# Patient Record
Sex: Female | Born: 1990
Health system: Southern US, Community
[De-identification: ages and names within clinical notes are randomized; demographics above are authoritative.]

## PROBLEM LIST (undated history)

## (undated) DIAGNOSIS — Z789 Other specified health status: Secondary | ICD-10-CM

## (undated) DIAGNOSIS — T7840XA Allergy, unspecified, initial encounter: Secondary | ICD-10-CM

## (undated) HISTORY — PX: NO PAST SURGERIES: SHX2092

## (undated) HISTORY — DX: Allergy, unspecified, initial encounter: T78.40XA

---

## 2015-04-24 LAB — OB RESULTS CONSOLE GC/CHLAMYDIA
Chlamydia: NEGATIVE
Gonorrhea: NEGATIVE

## 2015-04-24 LAB — OB RESULTS CONSOLE HIV ANTIBODY (ROUTINE TESTING): HIV: NONREACTIVE

## 2015-04-24 LAB — OB RESULTS CONSOLE RUBELLA ANTIBODY, IGM: Rubella: IMMUNE

## 2015-04-24 LAB — OB RESULTS CONSOLE ABO/RH: RH Type: POSITIVE

## 2015-04-24 LAB — OB RESULTS CONSOLE ANTIBODY SCREEN: Antibody Screen: NEGATIVE

## 2015-04-24 LAB — OB RESULTS CONSOLE RPR: RPR: NONREACTIVE

## 2015-04-24 LAB — OB RESULTS CONSOLE HEPATITIS B SURFACE ANTIGEN: Hepatitis B Surface Ag: NEGATIVE

## 2015-06-08 NOTE — L&D Delivery Note (Signed)
Delivery Note At 4:32 AM a viable female was delivered via Vaginal, Spontaneous Delivery (Presentation: Left Occiput Anterior).  APGAR: per nursing notes- spontaneous cry, good tone and color , ; weight  .  pending Placenta status: Intact, Spontaneous.  Cord: 3 vessels with the following complications: Marginal cord insertion and true Knot in cord.  Cord pH: not indicated  Anesthesia: Epidural  Episiotomy: None Lacerations: 1st degree Suture Repair: 3.0 vicryl rapide single figure of 8 placed to control bleeding Est. Blood Loss (mL):  300cc  Mom to postpartum.  Baby to Couplet care / Skin to Skin.  Shenna Brissette A. 11/03/2015, 4:44 AM

## 2015-10-08 LAB — OB RESULTS CONSOLE GBS: GBS: NEGATIVE

## 2015-11-03 ENCOUNTER — Inpatient Hospital Stay (HOSPITAL_COMMUNITY): Payer: 59 | Admitting: Anesthesiology

## 2015-11-03 ENCOUNTER — Encounter (HOSPITAL_COMMUNITY): Payer: Self-pay

## 2015-11-03 ENCOUNTER — Inpatient Hospital Stay (HOSPITAL_COMMUNITY)
Admission: AD | Admit: 2015-11-03 | Discharge: 2015-11-04 | DRG: 775 | Disposition: A | Discharge status: Home / Self Care | Payer: 59 | Source: Ambulatory Visit | Attending: Obstetrics | Admitting: Obstetrics

## 2015-11-03 DIAGNOSIS — O43123 Velamentous insertion of umbilical cord, third trimester: Secondary | ICD-10-CM | POA: Diagnosis present

## 2015-11-03 DIAGNOSIS — Z3A39 39 weeks gestation of pregnancy: Secondary | ICD-10-CM

## 2015-11-03 DIAGNOSIS — IMO0001 Reserved for inherently not codable concepts without codable children: Secondary | ICD-10-CM

## 2015-11-03 HISTORY — DX: Other specified health status: Z78.9

## 2015-11-03 LAB — TYPE AND SCREEN
ABO/RH(D): O POS
Antibody Screen: NEGATIVE

## 2015-11-03 LAB — ABO/RH: ABO/RH(D): O POS

## 2015-11-03 LAB — CBC
HCT: 36.3 % (ref 36.0–46.0)
Hemoglobin: 11.7 g/dL — ABNORMAL LOW (ref 12.0–15.0)
MCH: 28.4 pg (ref 26.0–34.0)
MCHC: 32.2 g/dL (ref 30.0–36.0)
MCV: 88.1 fL (ref 78.0–100.0)
Platelets: 208 10*3/uL (ref 150–400)
RBC: 4.12 MIL/uL (ref 3.87–5.11)
RDW: 13.1 % (ref 11.5–15.5)
WBC: 9.6 10*3/uL (ref 4.0–10.5)

## 2015-11-03 MED ORDER — OXYTOCIN BOLUS FROM INFUSION
500.0000 mL | INTRAVENOUS | Status: DC
  Administered 2015-11-03: 500 mL via INTRAVENOUS

## 2015-11-03 MED ORDER — ACETAMINOPHEN 325 MG PO TABS: 650.0000 mg | ORAL_TABLET | ORAL | Status: DC | PRN

## 2015-11-03 MED ORDER — SIMETHICONE 80 MG PO CHEW: 80.0000 mg | CHEWABLE_TABLET | ORAL | Status: DC | PRN

## 2015-11-03 MED ORDER — DIPHENHYDRAMINE HCL 25 MG PO CAPS: 25.0000 mg | ORAL_CAPSULE | Freq: Four times a day (QID) | ORAL | Status: DC | PRN

## 2015-11-03 MED ORDER — DIBUCAINE 1 % RE OINT
1.0000 "application " | TOPICAL_OINTMENT | RECTAL | Status: DC | PRN
  Administered 2015-11-03: 1 via RECTAL
  Filled 2015-11-03: qty 28

## 2015-11-03 MED ORDER — COCONUT OIL OIL: 1.0000 "application " | TOPICAL_OIL | Status: DC | PRN

## 2015-11-03 MED ORDER — LACTATED RINGERS IV SOLN: 500.0000 mL | Freq: Once | INTRAVENOUS | Status: DC

## 2015-11-03 MED ORDER — SOD CITRATE-CITRIC ACID 500-334 MG/5ML PO SOLN: 30.0000 mL | ORAL | Status: DC | PRN

## 2015-11-03 MED ORDER — LIDOCAINE HCL (PF) 1 % IJ SOLN
30.0000 mL | INTRAMUSCULAR | Status: DC | PRN
  Filled 2015-11-03: qty 30

## 2015-11-03 MED ORDER — EPHEDRINE 5 MG/ML INJ: 10.0000 mg | INTRAVENOUS | Status: DC | PRN

## 2015-11-03 MED ORDER — OXYCODONE-ACETAMINOPHEN 5-325 MG PO TABS: 1.0000 | ORAL_TABLET | ORAL | Status: DC | PRN

## 2015-11-03 MED ORDER — PHENYLEPHRINE 40 MCG/ML (10ML) SYRINGE FOR IV PUSH (FOR BLOOD PRESSURE SUPPORT): 80.0000 ug | PREFILLED_SYRINGE | INTRAVENOUS | Status: DC | PRN

## 2015-11-03 MED ORDER — ONDANSETRON HCL 4 MG PO TABS: 4.0000 mg | ORAL_TABLET | ORAL | Status: DC | PRN

## 2015-11-03 MED ORDER — OXYCODONE-ACETAMINOPHEN 5-325 MG PO TABS: 2.0000 | ORAL_TABLET | ORAL | Status: DC | PRN

## 2015-11-03 MED ORDER — PRENATAL MULTIVITAMIN CH
1.0000 | ORAL_TABLET | Freq: Every day | ORAL | Status: DC
  Administered 2015-11-03 – 2015-11-04 (×2): 1 via ORAL
  Filled 2015-11-03 (×2): qty 1

## 2015-11-03 MED ORDER — SENNOSIDES-DOCUSATE SODIUM 8.6-50 MG PO TABS
2.0000 | ORAL_TABLET | ORAL | Status: DC
  Administered 2015-11-03: 2 via ORAL
  Filled 2015-11-03: qty 2

## 2015-11-03 MED ORDER — WITCH HAZEL-GLYCERIN EX PADS
1.0000 "application " | MEDICATED_PAD | CUTANEOUS | Status: DC | PRN
  Administered 2015-11-03: 1 via TOPICAL

## 2015-11-03 MED ORDER — IBUPROFEN 600 MG PO TABS
600.0000 mg | ORAL_TABLET | Freq: Four times a day (QID) | ORAL | Status: DC
  Administered 2015-11-03 – 2015-11-04 (×6): 600 mg via ORAL
  Filled 2015-11-03 (×6): qty 1

## 2015-11-03 MED ORDER — ONDANSETRON HCL 4 MG/2ML IJ SOLN: 4.0000 mg | Freq: Four times a day (QID) | INTRAMUSCULAR | Status: DC | PRN

## 2015-11-03 MED ORDER — ZOLPIDEM TARTRATE 5 MG PO TABS: 5.0000 mg | ORAL_TABLET | Freq: Every evening | ORAL | Status: DC | PRN

## 2015-11-03 MED ORDER — PHENYLEPHRINE 40 MCG/ML (10ML) SYRINGE FOR IV PUSH (FOR BLOOD PRESSURE SUPPORT)
80.0000 ug | PREFILLED_SYRINGE | INTRAVENOUS | Status: DC | PRN
Start: 2015-11-03 — End: 2015-11-03
  Filled 2015-11-03: qty 10

## 2015-11-03 MED ORDER — FENTANYL 2.5 MCG/ML BUPIVACAINE 1/10 % EPIDURAL INFUSION (WH - ANES)
14.0000 mL/h | INTRAMUSCULAR | Status: DC | PRN
  Administered 2015-11-03: 14 mL/h via EPIDURAL
  Filled 2015-11-03: qty 125

## 2015-11-03 MED ORDER — LACTATED RINGERS IV SOLN: 500.0000 mL | INTRAVENOUS | Status: DC | PRN

## 2015-11-03 MED ORDER — DIPHENHYDRAMINE HCL 50 MG/ML IJ SOLN: 12.5000 mg | INTRAMUSCULAR | Status: DC | PRN

## 2015-11-03 MED ORDER — ONDANSETRON HCL 4 MG/2ML IJ SOLN: 4.0000 mg | INTRAMUSCULAR | Status: DC | PRN

## 2015-11-03 MED ORDER — BENZOCAINE-MENTHOL 20-0.5 % EX AERO
1.0000 "application " | INHALATION_SPRAY | CUTANEOUS | Status: DC | PRN
  Administered 2015-11-03: 1 via TOPICAL
  Filled 2015-11-03: qty 56

## 2015-11-03 MED ORDER — LACTATED RINGERS IV SOLN
INTRAVENOUS | Status: DC
  Administered 2015-11-03: 01:00:00 via INTRAVENOUS

## 2015-11-03 MED ORDER — LIDOCAINE HCL (PF) 1 % IJ SOLN
INTRAMUSCULAR | Status: DC | PRN
  Administered 2015-11-03: 3 mL
  Administered 2015-11-03: 5 mL
  Administered 2015-11-03: 2 mL

## 2015-11-03 MED ORDER — FLEET ENEMA 7-19 GM/118ML RE ENEM: 1.0000 | ENEMA | RECTAL | Status: DC | PRN

## 2015-11-03 MED ORDER — OXYTOCIN 40 UNITS IN LACTATED RINGERS INFUSION - SIMPLE MED
2.5000 [IU]/h | INTRAVENOUS | Status: DC
  Filled 2015-11-03: qty 1000

## 2015-11-03 MED ORDER — TETANUS-DIPHTH-ACELL PERTUSSIS 5-2.5-18.5 LF-MCG/0.5 IM SUSP: 0.5000 mL | Freq: Once | INTRAMUSCULAR | Status: DC

## 2015-11-03 NOTE — Anesthesia Preprocedure Evaluation (Signed)
Anesthesia Evaluation  Patient identified by MRN, date of birth, ID band Patient awake    Reviewed: Allergy & Precautions, Patient's Chart, lab work & pertinent test results  Airway Mallampati: II  TM Distance: >3 FB     Dental   Pulmonary neg pulmonary ROS,    Pulmonary exam normal        Cardiovascular negative cardio ROS Normal cardiovascular exam     Neuro/Psych negative neurological ROS     GI/Hepatic negative GI ROS, Neg liver ROS,   Endo/Other  negative endocrine ROS  Renal/GU negative Renal ROS     Musculoskeletal   Abdominal   Peds  Hematology negative hematology ROS (+)   Anesthesia Other Findings   Reproductive/Obstetrics (+) Pregnancy                             Lab Results  Component Value Date   WBC 9.6 11/03/2015   HGB 11.7* 11/03/2015   HCT 36.3 11/03/2015   MCV 88.1 11/03/2015   PLT 208 11/03/2015    Anesthesia Physical Anesthesia Plan  ASA: II  Anesthesia Plan: Epidural   Post-op Pain Management:    Induction:   Airway Management Planned: Natural Airway  Additional Equipment:   Intra-op Plan:   Post-operative Plan:   Informed Consent: I have reviewed the patients History and Physical, chart, labs and discussed the procedure including the risks, benefits and alternatives for the proposed anesthesia with the patient or authorized representative who has indicated his/her understanding and acceptance.     Plan Discussed with:   Anesthesia Plan Comments:         Anesthesia Quick Evaluation

## 2015-11-03 NOTE — Anesthesia Pain Management Evaluation Note (Signed)
  CRNA Pain Management Visit Note  Patient: Lauren Weaver, 25 y.o., female  "Hello I am a member of the anesthesia team at [facility]. We have an anesthesia team available at all times to provide care throughout the hospital, including epidural management and anesthesia for C-section. I don't know your plan for the delivery whether it a natural birth, water birth, IV sedation, nitrous supplementation, doula or epidural, but we want to meet your pain goals."   1.Was your pain managed to your expectations on prior hospitalizations?  yes}  2.What is your expectation for pain management during this hospitalization?     epidural  3.How can we help you reach that goal? epidural  Record the patient's initial score and the patient's pain goal.   Pain: 8  Pain Goal: 0/10 [facility] wants you to be able to say your pain was always managed very well.  Salome ArntSterling, Magin Balbi Marie 11/03/2015

## 2015-11-03 NOTE — MAU Note (Signed)
Pt c/o contractions that started at 930pm. Was 4cm dilated at last exam. Denies LOF or vag bleeding. +FM.

## 2015-11-03 NOTE — Anesthesia Procedure Notes (Signed)
Epidural Patient location during procedure: OB  Staffing Anesthesiologist: Marcene DuosFITZGERALD, Dannica Bickham Performed by: anesthesiologist   Preanesthetic Checklist Completed: patient identified, site marked, surgical consent, pre-op evaluation, timeout performed, IV checked, risks and benefits discussed and monitors and equipment checked  Epidural Patient position: sitting Prep: site prepped and draped and DuraPrep Patient monitoring: continuous pulse ox and blood pressure Approach: midline Location: L4-L5 Injection technique: LOR air  Needle:  Needle type: Tuohy  Needle gauge: 17 G Needle length: 9 cm and 9 Needle insertion depth: 5 cm cm Catheter type: closed end flexible Catheter size: 19 Gauge Catheter at skin depth: 10.5 cm Test dose: negative  Assessment Events: blood not aspirated, injection not painful, no injection resistance, negative IV test and no paresthesia

## 2015-11-03 NOTE — Anesthesia Postprocedure Evaluation (Signed)
Anesthesia Post Note  Patient: Lauren SalkJennifer Billick  Procedure(s) Performed: * No procedures listed *  Patient location during evaluation: Mother Baby Anesthesia Type: Epidural Level of consciousness: oriented and awake and alert Pain management: pain level controlled Vital Signs Assessment: post-procedure vital signs reviewed and stable Respiratory status: spontaneous breathing and nonlabored ventilation Cardiovascular status: stable Postop Assessment: epidural receding, patient able to bend at knees, no signs of nausea or vomiting and adequate PO intake Anesthetic complications: no     Last Vitals:  Filed Vitals:   11/03/15 0645 11/03/15 1027  BP: 113/65 103/65  Pulse: 63 63  Temp: 37 C 36.7 C  Resp: 16     Last Pain:  Filed Vitals:   11/03/15 1039  PainSc: 0-No pain   Pain Goal:                 Briann Sarchet Hristova

## 2015-11-03 NOTE — Lactation Note (Signed)
This note was copied from a baby's chart. Lactation Consultation Note  Patient Name: Lauren Weaver XBJYN'WToday's Date: 11/03/2015 Reason for consult: Initial assessment Baby at 7 hr of life. Mom reports "pinchin" when baby first latches but is goes away after "a few seconds" . Answered questions about nipple creams. Discussed baby behavior, feeding frequency, pumping, baby belly size, voids, wt loss, breast changes, and nipple care. Demonstrated manual expression, colostrum noted bilaterally, spoon in room. Given lactation handouts. Aware of OP services and support group. Mom will call at next feeding for latch check.      Maternal Data Has patient been taught Hand Expression?: Yes  Feeding Feeding Type: Breast Fed Length of feed: 30 min  LATCH Score/Interventions Latch: Repeated attempts needed to sustain latch, nipple held in mouth throughout feeding, stimulation needed to elicit sucking reflex. Intervention(s): Assist with latch  Audible Swallowing: A few with stimulation Intervention(s): Skin to skin;Hand expression  Type of Nipple: Everted at rest and after stimulation  Comfort (Breast/Nipple): Soft / non-tender     Hold (Positioning): No assistance needed to correctly position infant at breast.  LATCH Score: 8  Lactation Tools Discussed/Used WIC Program: No   Consult Status Consult Status: Follow-up Date: 11/04/15 Follow-up type: In-patient    Rulon Eisenmengerlizabeth E Tanda Morrissey 11/03/2015, 11:40 AM

## 2015-11-03 NOTE — H&P (Signed)
Lauren SalkJennifer Cosper is a 25 y.o. G2P1001 at 2317w0d presenting for contraction. Pt notes contractions getting stronger . Good fetal movement, No vaginal bleeding, not leaking fluid.  [facility] 11 wks - h/o rapid labor as G1, 38 wks was 4/90% - 22# wt gain   Prenatal Transfer Tool  Maternal Diabetes: No Genetic Screening: Declined Maternal Ultrasounds/Referrals: Normal Fetal Ultrasounds or other Referrals:  None Maternal Substance Abuse:  No Significant Maternal Medications:  None Significant Maternal Lab Results: None     OB History    Gravida Para Term Preterm AB TAB SAB Ectopic Multiple Living   2 1 1       1      Past Medical History  Diagnosis Date  . Medical history non-contributory    Past Surgical History  Procedure Laterality Date  . No past surgeries     Family History: family history is not on file. Social History:  reports that she has never smoked. She does not have any smokeless tobacco history on file. She reports that she does not drink alcohol or use illicit drugs.  Review of Systems - Negative except contractions   Dilation: 6 Effacement (%): 90 Station: -1 Exam by:: V Rogers RN  Blood pressure 124/73, pulse 61, temperature 98.6 F (37 C), temperature source Oral, resp. rate 16, height 5\' 3"  (1.6 m), weight 62.596 kg (138 lb), SpO2 96 %.  Physical Exam:  Gen: well appearing, no distress Abd: gravid, NT, no RUQ pain LE: no edema, equal bilaterally, non-tender Toco: triplets q891min every 3-4 min FH: baseline 135s, accelerations present, no deceleratons, 10 beat variability  Prenatal labs: ABO, Rh: --/--/O POS (05/29 0123) Antibody: NEG (05/29 0123) Rubella: !Error! immune RPR: Nonreactive (11/17 0000)  HBsAg: Negative (11/17 0000)  HIV: Non-reactive (11/17 0000)  GBS: Negative (05/03 0000)  1 hr Glucola 99  Genetic screening declined, nl AFP Anatomy US normal   Assessment/Plan: 25 y.o. G2P1001 at 2317w0d Active labor at  term, GBS neg, expectant management Epidural   Meldon Hanzlik A. 11/03/2015, 3:36 AM

## 2015-11-04 LAB — RPR: RPR Ser Ql: NONREACTIVE

## 2015-11-04 MED ORDER — IBUPROFEN 600 MG PO TABS: 600.0000 mg | ORAL_TABLET | Freq: Four times a day (QID) | ORAL | Status: DC

## 2015-11-04 NOTE — Progress Notes (Signed)
PPD 1 SVD with 1st degree repair  S:  Reports feeling well and desires DC today             Tolerating po/ No nausea or vomiting             Bleeding is light             Pain controlled with motrin             Up ad lib / ambulatory / voiding QS  Newborn breast feeding   O:               VS: BP 114/67 mmHg  Pulse 52  Temp(Src) 97.8 F (36.6 C) (Oral)  Resp 18  Ht 5\' 3"  (1.6 m)  Wt 62.596 kg (138 lb)  BMI 24.45 kg/m2  SpO2 100%  Breastfeeding? Unknown   LABS:              Recent Labs  11/03/15 0123  WBC 9.6  HGB 11.7*  PLT 208               Blood type: --/--/O POS, O POS (05/29 0123)  Rubella: Immune (11/17 0000)                                Physical Exam:             Alert and oriented X3  Abdomen: soft, non-tender, non-distended              Fundus: firm, non-tender, U-1  Perineum: no edema  Lochia: light  Extremities: no edema, no calf pain or tenderness    A: PPD # 1 SVD with 1st degree repair  Doing well - stable status  P: Routine post partum orders  DC home - WOB booklet - instructions reviewed  Lauren Weaver, Lauren Weaver CNM, MSN, FACNM 11/04/2015, 8:18 AM

## 2015-11-04 NOTE — Lactation Note (Signed)
This note was copied from a baby's chart. Lactation Consultation Note: Experienced BF mom reports baby has been nursing well but reports pinching throughout feeding. Baby has just finished nursing for 20 min, did latch to other breast bottom, lip untucked but mom still reports it is tender. Reports breasts are feeling fuller today. No DC today baby to go under bili lights. No questions at present. To call for assist prn   Patient Name: Lauren Hollie SalkJennifer Borgerding RUEAV'WToday's Date: 11/04/2015 Reason for consult: Follow-up assessment   Maternal Data Formula Feeding for Exclusion: No Has patient been taught Hand Expression?: Yes Does the patient have breastfeeding experience prior to this delivery?: Yes  Feeding Feeding Type: Breast Fed Length of feed: 30 min  LATCH Score/Interventions Latch: Grasps breast easily, tongue down, lips flanged, rhythmical sucking. Intervention(s): Assist with latch  Audible Swallowing: A few with stimulation  Type of Nipple: Everted at rest and after stimulation  Comfort (Breast/Nipple): Soft / non-tender  Problem noted: Mild/Moderate discomfort Interventions (Mild/moderate discomfort): Hand expression;Hand massage  Hold (Positioning): Assistance needed to correctly position infant at breast and maintain latch.  LATCH Score: 8  Lactation Tools Discussed/Used WIC Program: No   Consult Status Consult Status: PRN    Pamelia HoitWeeks, Nazier Neyhart D 11/04/2015, 9:57 AM

## 2015-11-04 NOTE — Discharge Summary (Signed)
Obstetric Discharge Summary  Reason for Admission: onset of labor Prenatal Procedures: none Intrapartum Procedures: spontaneous vaginal delivery Postpartum Procedures: none Complications-Operative and Postpartum: 1st degree perineal laceration HEMOGLOBIN  Date Value Ref Range Status  11/03/2015 11.7* 12.0 - 15.0 g/dL Final   HCT  Date Value Ref Range Status  11/03/2015 36.3 36.0 - 46.0 % Final    Physical Exam:  General: alert, cooperative and no distress Lochia: appropriate Uterine Fundus: firm Incision: healing well DVT Evaluation: No evidence of DVT seen on physical exam.  Discharge Diagnoses: Term Pregnancy-delivered  Discharge Information: Date: 11/04/2015 Activity: pelvic rest Diet: routine Medications: PNV and Ibuprofen Condition: stable Instructions: refer to practice specific booklet Discharge to: home Follow-up Information    Follow up with LAVOIE,MARIE-LYNE, MD. Schedule an appointment as soon as possible for a visit in 6 weeks.   Specialty:  Obstetrics and Gynecology   Contact information:   [address] [place] [id number] [phone]       Newborn Data: Live born female  Birth Weight: 7 lb 6 oz (3345 g) APGAR: 8, 9  Home with mother.  Lauren MikeBAILEY, TANYA 11/04/2015, 8:44 AM

## 2015-11-05 ENCOUNTER — Ambulatory Visit: Payer: Self-pay

## 2015-11-05 NOTE — Lactation Note (Signed)
This note was copied from a baby's chart. Lactation Consultation Note Baby had 10% weight loss at 45 hours old. Had 17 voids, 7 stools and several emesis. Mom breast are filling rick orange colostrum. Mom wearing supportive bra. Hand expression, DEBP given to mom to relieve breast. Discussed w/mom supplementing after BF. Baby on DPT. Baby BF well. Will cont. To monitor/ Patient Name: Girl Hollie SalkJennifer Ordoyne JXBJY'NToday's Date: 11/05/2015 Reason for consult: Follow-up assessment;Hyperbilirubinemia;Infant weight loss   Maternal Data    Feeding Feeding Type: Breast Fed Length of feed: 60 min  LATCH Score/Interventions    Intervention(s): Alternate breast massage;Hand expression  Type of Nipple: Everted at rest and after stimulation  Comfort (Breast/Nipple): Filling, red/small blisters or bruises, mild/mod discomfort  Problem noted: Filling Interventions (Filling): Massage;Firm support;Frequent nursing;Double electric pump Interventions (Mild/moderate discomfort): Post-pump;Hand massage;Hand expression        Lactation Tools Discussed/Used Tools: Pump Breast pump type: Double-Electric Breast Pump Pump Review: Setup, frequency, and cleaning;Milk Storage Initiated by:: RN Date initiated:: 11/04/15   Consult Status Consult Status: Follow-up Date: 11/05/15 (in pm) Follow-up type: In-patient    Charyl DancerCARVER, Render Marley G 11/05/2015, 6:40 AM

## 2015-11-05 NOTE — Lactation Note (Signed)
This note was copied from a baby's chart. Lactation Consultation Note  Patient Name: Lauren Weaver UJWJX'BToday's Date: 11/05/2015 Reason for consult: Follow-up assessment;Hyperbilirubinemia Baby 59 hours old. Mom reports that baby has been a little sleepy at breast. Mom reports that she is nursing with cues, and at least by 3 hours. Mom has been using DEBP after baby nursing and supplementing with EBM as needed. Enc mom to continue. Mom latched baby in cradle position to right breast. Baby had shallow latch, so demonstrated to mom how to latch baby in cross-cradle position and mom reported increased comfort. Baby latched deeply and suckled rhythmically with intermittent swallows/gulps. Enc mom to nurse often.  Maternal Data    Feeding Feeding Type: Breast Fed (Simultaneous filing. User may not have seen previous data.) Length of feed:  (LC assessed first 10 minutes of BF.  Simultaneous filing. User may not have seen previous data.)  LATCH Score/Interventions Latch: Grasps breast easily, tongue down, lips flanged, rhythmical sucking. Intervention(s): Adjust position;Assist with latch  Audible Swallowing: Spontaneous and intermittent Intervention(s): Skin to skin;Hand expression  Type of Nipple: Everted at rest and after stimulation  Comfort (Breast/Nipple): Soft / non-tender  Problem noted: Filling Interventions (Filling): Frequent nursing;Double electric pump  Hold (Positioning): Assistance needed to correctly position infant at breast and maintain latch. Intervention(s): Position options  LATCH Score: 9  Lactation Tools Discussed/Used Breast pump type: Double-Electric Breast Pump   Consult Status Consult Status: Follow-up Date: 11/06/15 Follow-up type: In-patient    Geralynn OchsWILLIARD, Lauren 11/05/2015, 4:03 PM

## 2015-11-06 ENCOUNTER — Ambulatory Visit: Payer: Self-pay

## 2015-11-06 NOTE — Lactation Note (Signed)
This note was copied from a baby's chart. Lactation Consultation Note  Mother is continuing to give baby pumped breastmilk after feeding. Mother recently pumped more than 30 ml. Reviewed engorgement care and monitoring voids/stools. Provided mother w/ hand pump.  Patient Name: Lauren Weaver SalkJennifer Kearse ZOXWR'UToday's Date: 11/06/2015 Reason for consult: Follow-up assessment   Maternal Data    Feeding Feeding Type: Breast Fed Length of feed: 30 min  LATCH Score/Interventions                      Lactation Tools Discussed/Used     Consult Status Consult Status: Complete    Hardie PulleyBerkelhammer, Ruth Boschen 11/06/2015, 10:07 AM

## 2018-05-02 ENCOUNTER — Encounter: Payer: Self-pay | Admitting: Allergy and Immunology

## 2018-05-02 ENCOUNTER — Ambulatory Visit: Payer: BLUE CROSS/BLUE SHIELD | Admitting: Allergy and Immunology

## 2018-05-02 VITALS — BP 106/66 | HR 61 | Temp 98.6°F | Resp 16 | Ht 64.0 in | Wt 132.0 lb

## 2018-05-02 DIAGNOSIS — H1013 Acute atopic conjunctivitis, bilateral: Secondary | ICD-10-CM | POA: Diagnosis not present

## 2018-05-02 DIAGNOSIS — J3089 Other allergic rhinitis: Secondary | ICD-10-CM

## 2018-05-02 DIAGNOSIS — H1010 Acute atopic conjunctivitis, unspecified eye: Secondary | ICD-10-CM | POA: Insufficient documentation

## 2018-05-02 MED ORDER — OLOPATADINE HCL 0.7 % OP SOLN: 1.0000 [drp] | OPHTHALMIC | 5 refills | Status: DC

## 2018-05-02 MED ORDER — FLUTICASONE PROPIONATE 93 MCG/ACT NA EXHU: 2.0000 | INHALANT_SUSPENSION | Freq: Two times a day (BID) | NASAL | 5 refills | Status: DC

## 2018-05-02 MED ORDER — LEVOCETIRIZINE DIHYDROCHLORIDE 5 MG PO TABS: 5.0000 mg | ORAL_TABLET | Freq: Every evening | ORAL | 5 refills | Status: DC

## 2018-05-02 NOTE — Progress Notes (Signed)
New Patient Note  RE: Lauren Weaver MRN: [id number] DOB: 1990-09-22 Date of Office Visit: 05/02/2018  Referring provider: Arlana Lindau, NP Primary care provider: Arlana Lindau, NP  Chief Complaint: Nasal Congestion; Conjunctivitis; and Sinus Problem   History of present illness: Lauren Weaver is a 27 y.o. female seen today in consultation requested by Arlana Lindau, NP.  She experiences frequent nasal congestion, rhinorrhea, sneezing, postnasal drainage, ear pressure, nasal pruritus, ocular pruritus, and lacrimation.  The symptoms are most frequent and severe during the fall, particularly September and August.  She has attempted to control the symptoms with loratadine without relief and has tried diphenhydramine with moderate benefit.  She has no history of symptoms consistent with asthma or eczema.  Assessment and plan: Allergic rhinitis  Aeroallergen avoidance measures have been discussed and provided in written form.  A prescription has been provided for levocetirizine, 5 mg daily as needed.  A prescription has been provided for [facility], 2 actuations per nostril twice a day. Proper technique has been discussed and demonstrated.  Nasal saline spray (i.e., Simply Saline) or nasal saline lavage (i.e., NeilMed) is recommended as needed and prior to medicated nasal sprays.  If allergen avoidance measures and medications fail to adequately relieve symptoms, aeroallergen immunotherapy will be considered.  Allergic conjunctivitis  Treatment plan as outlined above for allergic rhinitis.  A prescription has been provided for Pazeo, one drop per eye daily as needed.  I have also recommended eye lubricant drops (i.e., Natural Tears) as needed.   Meds ordered this encounter  Medications  . levocetirizine (XYZAL) 5 MG tablet    Sig: Take 1 tablet (5 mg total) by mouth every evening.    Dispense:  30 tablet    Refill:  5  . Fluticasone Propionate (XHANCE) 93 MCG/ACT EXHU      Sig: Place 2 sprays into both nostrils 2 (two) times daily.    Dispense:  16 mL    Refill:  5  . Olopatadine HCl (PAZEO) 0.7 % SOLN    Sig: Place 1 drop into both eyes 1 day or 1 dose.    Dispense:  1 Bottle    Refill:  5    Diagnostics: Epicutaneous testing: Robust reactivity to dust mite antigen. Intradermal testing: Positive to grass pollen, tree pollen, molds, cat hair, dog epithelia, and cockroach antigen.    Physical examination: Blood pressure 106/66, pulse 61, temperature 98.6 F (37 C), temperature source Oral, resp. rate 16, height 5\' 4"  (1.626 m), weight 132 lb (59.9 kg), SpO2 99 %, unknown if currently breastfeeding.  General: Alert, interactive, in no acute distress. HEENT: TMs pearly gray, turbinates edematous with thick discharge, post-pharynx moderately erythematous. Neck: Supple without lymphadenopathy. Lungs: Clear to auscultation without wheezing, rhonchi or rales. CV: Normal S1, S2 without murmurs. Abdomen: Nondistended, nontender. Skin: Warm and dry, without lesions or rashes. Extremities:  No clubbing, cyanosis or edema. Neuro:   Grossly intact.  Review of systems:  Review of systems negative except as noted in HPI / PMHx or noted below: Review of Systems  Constitutional: Negative.   HENT: Negative.   Eyes: Negative.   Respiratory: Negative.   Cardiovascular: Negative.   Gastrointestinal: Negative.   Genitourinary: Negative.   Musculoskeletal: Negative.   Skin: Negative.   Neurological: Negative.   Endo/Heme/Allergies: Negative.   Psychiatric/Behavioral: Negative.     Past medical history:  Past Medical History:  Diagnosis Date  . Medical history non-contributory   . Postpartum care following vaginal delivery (5/29) 11/03/2015  Past surgical history:  Past Surgical History:  Procedure Laterality Date  . NO PAST SURGERIES      Family history: History reviewed. No pertinent family history.  Social history: Social History    Socioeconomic History  . Marital status: Married    Spouse name: Not on file  . Number of children: Not on file  . Years of education: Not on file  . Highest education level: Not on file  Occupational History  . Not on file  Social Needs  . Financial resource strain: Not on file  . Food insecurity:    Worry: Not on file    Inability: Not on file  . Transportation needs:    Medical: Not on file    Non-medical: Not on file  Tobacco Use  . Smoking status: Never Smoker  . Smokeless tobacco: Never Used  Substance and Sexual Activity  . Alcohol use: No  . Drug use: No  . Sexual activity: Yes  Lifestyle  . Physical activity:    Days per week: Not on file    Minutes per session: Not on file  . Stress: Not on file  Relationships  . Social connections:    Talks on phone: Not on file    Gets together: Not on file    Attends religious service: Not on file    Active member of club or organization: Not on file    Attends meetings of clubs or organizations: Not on file    Relationship status: Not on file  . Intimate partner violence:    Fear of current or ex partner: Not on file    Emotionally abused: Not on file    Physically abused: Not on file    Forced sexual activity: Not on file  Other Topics Concern  . Not on file  Social History Narrative  . Not on file   Environmental History: Lives in a [address] year old house with carpeting the bedroom and central air/heat.  She is a non-smoker without pets.  She is uncertain if there is mold/water damage in the home.  Allergies as of 05/02/2018   No Known Allergies     Medication List        Accurate as of 05/02/18  8:21 PM. Always use your most recent med list.          Fluticasone Propionate 93 MCG/ACT Exhu Place 2 sprays into both nostrils 2 (two) times daily.   ibuprofen 600 MG tablet Commonly known as:  ADVIL,MOTRIN Take 1 tablet (600 mg total) by mouth every 6 (six) hours.   levocetirizine 5 MG tablet Commonly  known as:  XYZAL Take 1 tablet (5 mg total) by mouth every evening.   Olopatadine HCl 0.7 % Soln Place 1 drop into both eyes 1 day or 1 dose.   prenatal multivitamin Tabs tablet Take 1 tablet by mouth daily at 12 noon.       Known medication allergies: No Known Allergies  I appreciate the opportunity to take part in Kennice's care. Please do not hesitate to contact me with questions.  Sincerely,   R. Jorene Guestarter Ray Gervasi, MD

## 2018-05-02 NOTE — Patient Instructions (Addendum)
Allergic rhinitis  Aeroallergen avoidance measures have been discussed and provided in written form.  A prescription has been provided for levocetirizine, 5 mg daily as needed.  A prescription has been provided for [facility], 2 actuations per nostril twice a day. Proper technique has been discussed and demonstrated.  Nasal saline spray (i.e., Simply Saline) or nasal saline lavage (i.e., NeilMed) is recommended as needed and prior to medicated nasal sprays.  If allergen avoidance measures and medications fail to adequately relieve symptoms, aeroallergen immunotherapy will be considered.  Allergic conjunctivitis  Treatment plan as outlined above for allergic rhinitis.  A prescription has been provided for Pazeo, one drop per eye daily as needed.  I have also recommended eye lubricant drops (i.e., Natural Tears) as needed.   Return in about 4 months (around 08/31/2018), or if symptoms worsen or fail to improve.  Control of Dust Mite Allergen  House dust mites play a major role in allergic asthma and rhinitis.  They occur in environments with high humidity wherever human skin, the food for dust mites is found. High levels have been detected in dust obtained from mattresses, pillows, carpets, upholstered furniture, bed covers, clothes and soft toys.  The principal allergen of the house dust mite is found in its feces.  A gram of dust may contain 1,000 mites and 250,000 fecal particles.  Mite antigen is easily measured in the air during house cleaning activities.    1. Encase mattresses, including the box spring, and pillow, in an air tight cover.  Seal the zipper end of the encased mattresses with wide adhesive tape. 2. Wash the bedding in water of 130 degrees Farenheit weekly.  Avoid cotton comforters/quilts and flannel bedding: the most ideal bed covering is the dacron comforter. 3. Remove all upholstered furniture from the bedroom. 4. Remove carpets, carpet padding, rugs, and non-washable  window drapes from the bedroom.  Wash drapes weekly or use plastic window coverings. 5. Remove all non-washable stuffed toys from the bedroom.  Wash stuffed toys weekly. 6. Have the room cleaned frequently with a vacuum cleaner and a damp dust-mop.  The patient should not be in a room which is being cleaned and should wait 1 hour after cleaning before going into the room. 7. Close and seal all heating outlets in the bedroom.  Otherwise, the room will become filled with dust-laden air.  An electric heater can be used to heat the room. Reduce indoor humidity to less than 50%.  Do not use a humidifier.   Reducing Pollen Exposure  The American Academy of Allergy, Asthma and Immunology suggests the following steps to reduce your exposure to pollen during allergy seasons.    1. Do not hang sheets or clothing out to dry; pollen may collect on these items. 2. Do not mow lawns or spend time around freshly cut grass; mowing stirs up pollen. 3. Keep windows closed at night.  Keep car windows closed while driving. 4. Minimize morning activities outdoors, a time when pollen counts are usually at their highest. 5. Stay indoors as much as possible when pollen counts or humidity is high and on windy days when pollen tends to remain in the air longer. 6. Use air conditioning when possible.  Many air conditioners have filters that trap the pollen spores. 7. Use a HEPA room air filter to remove pollen form the indoor air you breathe.   Control of Dog or Cat Allergen  Avoidance is the best way to manage a dog or cat allergy. If  you have a dog or cat and are allergic to dog or cats, consider removing the dog or cat from the home. If you have a dog or cat but don't want to find it a new home, or if your family wants a pet even though someone in the household is allergic, here are some strategies that may help keep symptoms at bay:  1. Keep the pet out of your bedroom and restrict it to only a few rooms. Be  advised that keeping the dog or cat in only one room will not limit the allergens to that room. 2. Don't pet, hug or kiss the dog or cat; if you do, wash your hands with soap and water. 3. High-efficiency particulate air (HEPA) cleaners run continuously in a bedroom or living room can reduce allergen levels over time. 4. Place electrostatic material sheet in the air inlet vent in the bedroom. 5. Regular use of a high-efficiency vacuum cleaner or a central vacuum can reduce allergen levels. 6. Giving your dog or cat a bath at least once a week can reduce airborne allergen.   Control of Mold Allergen  Mold and fungi can grow on a variety of surfaces provided certain temperature and moisture conditions exist.  Outdoor molds grow on plants, decaying vegetation and soil.  The major outdoor mold, Alternaria and Cladosporium, are found in very high numbers during hot and dry conditions.  Generally, a late Summer - Fall peak is seen for common outdoor fungal spores.  Rain will temporarily lower outdoor mold spore count, but counts rise rapidly when the rainy period ends.  The most important indoor molds are Aspergillus and Penicillium.  Dark, humid and poorly ventilated basements are ideal sites for mold growth.  The next most common sites of mold growth are the bathroom and the kitchen.  Outdoor MicrosoftMold Control 1. Use air conditioning and keep windows closed 2. Avoid exposure to decaying vegetation. 3. Avoid leaf raking. 4. Avoid grain handling. 5. Consider wearing a face mask if working in moldy areas.  Indoor Mold Control 1. Maintain humidity below 50%. 2. Clean washable surfaces with 5% bleach solution. 3. Remove sources e.g. Contaminated carpets.   Control of Cockroach Allergen  Cockroach allergen has been identified as an important cause of acute attacks of asthma, especially in urban settings.  There are fifty-five species of cockroach that exist in the [place], however only three, the  [place], [place] and Oriental species produce allergen that can affect patients with Asthma.  Allergens can be obtained from fecal particles, egg casings and secretions from cockroaches.    1. Remove food sources. 2. Reduce access to water. 3. Seal access and entry points. 4. Spray runways with 0.5-1% Diazinon or Chlorpyrifos 5. Blow boric acid power under stoves and refrigerator. 6. Place bait stations (hydramethylnon) at feeding sites.

## 2018-05-02 NOTE — Assessment & Plan Note (Signed)
   Aeroallergen avoidance measures have been discussed and provided in written form.  A prescription has been provided for levocetirizine, 5 mg daily as needed.  A prescription has been provided for Xhance, 2 actuations per nostril twice a day. Proper technique has been discussed and demonstrated.  Nasal saline spray (i.e., Simply Saline) or nasal saline lavage (i.e., NeilMed) is recommended as needed and prior to medicated nasal sprays.  If allergen avoidance measures and medications fail to adequately relieve symptoms, aeroallergen immunotherapy will be considered. 

## 2018-05-02 NOTE — Assessment & Plan Note (Signed)
   Treatment plan as outlined above for allergic rhinitis.  A prescription has been provided for Pazeo, one drop per eye daily as needed.  I have also recommended eye lubricant drops (i.e., Natural Tears) as needed. 

## 2018-05-15 ENCOUNTER — Ambulatory Visit: Payer: Self-pay | Admitting: Allergy and Immunology

## 2018-06-14 DIAGNOSIS — L700 Acne vulgaris: Secondary | ICD-10-CM | POA: Diagnosis not present

## 2018-06-23 DIAGNOSIS — J069 Acute upper respiratory infection, unspecified: Secondary | ICD-10-CM | POA: Diagnosis not present

## 2018-06-23 DIAGNOSIS — J029 Acute pharyngitis, unspecified: Secondary | ICD-10-CM | POA: Diagnosis not present

## 2018-06-25 DIAGNOSIS — J40 Bronchitis, not specified as acute or chronic: Secondary | ICD-10-CM | POA: Diagnosis not present

## 2018-09-05 ENCOUNTER — Ambulatory Visit: Payer: BLUE CROSS/BLUE SHIELD | Admitting: Allergy and Immunology

## 2018-09-12 ENCOUNTER — Other Ambulatory Visit: Payer: Self-pay

## 2018-09-12 ENCOUNTER — Ambulatory Visit
Admission: EM | Admit: 2018-09-12 | Discharge: 2018-09-12 | Disposition: A | Discharge status: Home / Self Care | Payer: BLUE CROSS/BLUE SHIELD | Attending: Physician Assistant | Admitting: Physician Assistant

## 2018-09-12 DIAGNOSIS — J0140 Acute pansinusitis, unspecified: Secondary | ICD-10-CM | POA: Diagnosis not present

## 2018-09-12 MED ORDER — IPRATROPIUM BROMIDE 0.06 % NA SOLN: 2.0000 | Freq: Four times a day (QID) | NASAL | 12 refills | Status: DC

## 2018-09-12 MED ORDER — AMOXICILLIN-POT CLAVULANATE 875-125 MG PO TABS: 1.0000 | ORAL_TABLET | Freq: Two times a day (BID) | ORAL | 0 refills | Status: DC

## 2018-09-12 NOTE — Discharge Instructions (Signed)
Start augmentin and atrovent as directed. Restart flonase in day 5 to help with ear fullness/hearing loss. Follow up with PCP as needed for recheck if symptoms not improving.

## 2018-09-12 NOTE — ED Triage Notes (Signed)
Pt c/o bilateral ear pain and fullness with hearing loss since her sinus infection 3wks ago

## 2018-09-12 NOTE — ED Provider Notes (Signed)
[facility]    CSN: [id number] Arrival date & time: 09/12/18  1113     History   Chief Complaint Chief Complaint  Patient presents with  . Ear Fullness    HPI Lauren Weaver is a 28 y.o. female.   28 year old female comes in for few week history of bilateral ear pain, hearing loss.  Patient states about 3 weeks ago, started having rhinorrhea, nasal congestion, sinus pressure.  She had mild ear pain at that time.  She started on allergy medicine, Sudafed, Flonase with good relief.  She also used Afrin for 3 days.  States she stopped Sudafed, Flonase, Afrin after sinus pressure resolved.  However, bilateral ear pain persisted, and now worsening.  She has ear fullness, and has trouble hearing.  States "I feel like I need to pop my ears, but I cannot".  She denies any fever, chills, night sweats.  Denies any current cough, congestion, sore throat.     Past Medical History:  Diagnosis Date  . Medical history non-contributory   . Postpartum care following vaginal delivery (5/29) 11/03/2015    Patient Active Problem List   Diagnosis Date Noted  . Allergic rhinitis 05/02/2018  . Allergic conjunctivitis 05/02/2018  . Active labor 11/03/2015  . Term pregnancy delivered 11/03/2015  . Postpartum care following vaginal delivery (5/29) 11/03/2015    Past Surgical History:  Procedure Laterality Date  . NO PAST SURGERIES      OB History    Gravida  2   Para  2   Term  2   Preterm      AB      Living  1     SAB      TAB      Ectopic      Multiple  0   Live Births  1            Home Medications    Prior to Admission medications   Medication Sig Start Date End Date Taking? Authorizing Provider  amoxicillin-clavulanate (AUGMENTIN) 875-125 MG tablet Take 1 tablet by mouth every 12 (twelve) hours. 09/12/18   Cathie HoopsYu, Christol Thetford V, PA-C  Fluticasone Propionate (XHANCE) 93 MCG/ACT EXHU Place 2 sprays into both nostrils 2 (two) times daily. 05/02/18   Bobbitt,  Heywood Ilesalph Carter, MD  ibuprofen (ADVIL,MOTRIN) 600 MG tablet Take 1 tablet (600 mg total) by mouth every 6 (six) hours. 11/04/15   Marlinda MikeBailey, Tanya, CNM  ipratropium (ATROVENT) 0.06 % nasal spray Place 2 sprays into both nostrils 4 (four) times daily. 09/12/18   Cathie HoopsYu, Ramiah Helfrich V, PA-C  levocetirizine (XYZAL) 5 MG tablet Take 1 tablet (5 mg total) by mouth every evening. 05/02/18   Bobbitt, Heywood Ilesalph Carter, MD  Olopatadine HCl (PAZEO) 0.7 % SOLN Place 1 drop into both eyes 1 day or 1 dose. 05/02/18   Bobbitt, Heywood Ilesalph Carter, MD    Family History No family history on file.  Social History Social History   Tobacco Use  . Smoking status: Never Smoker  . Smokeless tobacco: Never Used  Substance Use Topics  . Alcohol use: No  . Drug use: No     Allergies   Patient has no known allergies.   Review of Systems Review of Systems  Reason unable to perform ROS: See HPI as above.     Physical Exam Triage Vital Signs ED Triage Vitals  Enc Vitals Group     BP 09/12/18 1125 (!) 143/96     Pulse Rate 09/12/18 1125 70  Resp 09/12/18 1125 18     Temp 09/12/18 1125 98 F (36.7 C)     Temp Source 09/12/18 1125 Oral     SpO2 09/12/18 1125 98 %     Weight --      Height --      Head Circumference --      Peak Flow --      Pain Score 09/12/18 1126 5     Pain Loc --      Pain Edu? --      Excl. in GC? --    No data found.  Updated Vital Signs BP 138/87 (BP Location: Left Arm)   Pulse 70   Temp 98 F (36.7 C) (Oral)   Resp 18   LMP 08/01/2018   SpO2 98%   Physical Exam Constitutional:      General: She is not in acute distress.    Appearance: Normal appearance. She is not ill-appearing, toxic-appearing or diaphoretic.  HENT:     Head: Normocephalic and atraumatic.     Right Ear: Ear canal and external ear normal. No middle ear effusion. Tympanic membrane is not erythematous or bulging.     Left Ear: Ear canal and external ear normal.  No middle ear effusion. Tympanic membrane is not  erythematous or bulging.     Nose: Congestion present. No rhinorrhea.     Right Sinus: Maxillary sinus tenderness present. No frontal sinus tenderness.     Left Sinus: Maxillary sinus tenderness present. No frontal sinus tenderness.     Mouth/Throat:     Mouth: Mucous membranes are moist.     Pharynx: Oropharynx is clear. Uvula midline.  Neck:     Musculoskeletal: Normal range of motion and neck supple.  Cardiovascular:     Rate and Rhythm: Normal rate and regular rhythm.     Heart sounds: Normal heart sounds. No murmur. No friction rub. No gallop.   Pulmonary:     Effort: Pulmonary effort is normal. No accessory muscle usage, prolonged expiration, respiratory distress or retractions.     Comments: Lungs clear to auscultation without adventitious lung sounds. Neurological:     General: No focal deficit present.     Mental Status: She is alert and oriented to person, place, and time.    UC Treatments / Results  Labs (all labs ordered are listed, but only abnormal results are displayed) Labs Reviewed - No data to display  EKG None  Radiology No results found.  Procedures Procedures (including critical care time)  Medications Ordered in UC Medications - No data to display  Initial Impression / Assessment and Plan / UC Course  I have reviewed the triage vital signs and the nursing notes.  Pertinent labs & imaging results that were available during my care of the patient were reviewed by me and considered in my medical decision making (see chart for details).    Will start on Augmentin to cover for bacterial sinusitis.  Atrovent as directed.  Patient to restart Flonase in 5 days to help with ear fullness possibly from eustachian tube dysfunction.  Return precautions given.  Patient expresses understanding and agrees to plan.  Final Clinical Impressions(s) / UC Diagnoses   Final diagnoses:  Acute non-recurrent pansinusitis    ED Prescriptions    Medication Sig Dispense  Auth. Provider   amoxicillin-clavulanate (AUGMENTIN) 875-125 MG tablet Take 1 tablet by mouth every 12 (twelve) hours. 14 tablet Tobechukwu Emmick V, PA-C   ipratropium (ATROVENT) 0.06 % nasal spray  Place 2 sprays into both nostrils 4 (four) times daily. 15 mL Threasa Alpha, [place] 09/12/18 1247

## 2018-09-25 ENCOUNTER — Encounter: Payer: Self-pay | Admitting: Family Medicine

## 2018-09-25 ENCOUNTER — Other Ambulatory Visit: Payer: Self-pay

## 2018-09-25 ENCOUNTER — Ambulatory Visit
Admission: EM | Admit: 2018-09-25 | Discharge: 2018-09-25 | Disposition: A | Discharge status: Home / Self Care | Payer: BLUE CROSS/BLUE SHIELD | Attending: Family Medicine | Admitting: Family Medicine

## 2018-09-25 DIAGNOSIS — J320 Chronic maxillary sinusitis: Secondary | ICD-10-CM | POA: Diagnosis not present

## 2018-09-25 DIAGNOSIS — H6503 Acute serous otitis media, bilateral: Secondary | ICD-10-CM

## 2018-09-25 MED ORDER — CEFDINIR 300 MG PO CAPS: 600.0000 mg | ORAL_CAPSULE | Freq: Every day | ORAL | 0 refills | Status: DC

## 2018-09-25 NOTE — ED Triage Notes (Signed)
Pt presents to [facility] for nasal congestion, sinus pressure, wet cough with yellow mucous production, and ear fullness and popping with pain.

## 2018-09-25 NOTE — ED Triage Notes (Signed)
Pt states she had improved since her last visit while taking the antibiotic.  Symptoms just returned 3 days ago

## 2018-09-25 NOTE — ED Notes (Signed)
Patient able to ambulate independently  

## 2018-09-25 NOTE — ED Provider Notes (Signed)
[facility]    CSN: [id number] Arrival date & time: 09/25/18  1234     History   Chief Complaint Chief Complaint  Patient presents with  . Facial Pain    HPI Lauren Weaver is a 28 y.o. female.   28 yo woman presents with facial pain.  She was seen two weeks ago with a several week h/o sinus congestion and ear pain.  Pt states she had improved since her last visit while taking the antibiotic.  Symptoms just returned 3 days ago  1239 [facility], sinus pressure, wet cough with yellow mucous production, and ear fullness and popping with pain.    She is a stay at home mother  Note from 08/12/2018: 28 year old female comes in for few week history of bilateral ear pain, hearing loss.  Patient states about 3 weeks ago, started having rhinorrhea, nasal congestion, sinus pressure.  She had mild ear pain at that time.  She started on allergy medicine, Sudafed, Flonase with good relief.  She also used Afrin for 3 days.  States she stopped Sudafed, Flonase, Afrin after sinus pressure resolved.  However, bilateral ear pain persisted, and now worsening.  She has ear fullness, and has trouble hearing.  States "I feel like I need to pop my ears, but I cannot".  She denies any fever, chills, night sweats.  Denies any current cough, congestion, sore throat.  Patient had sinus tenderness and was treated with Augmentin     Past Medical History:  Diagnosis Date  . Medical history non-contributory   . Postpartum care following vaginal delivery (5/29) 11/03/2015    Patient Active Problem List   Diagnosis Date Noted  . Allergic rhinitis 05/02/2018  . Allergic conjunctivitis 05/02/2018  . Active labor 11/03/2015  . Term pregnancy delivered 11/03/2015  . Postpartum care following vaginal delivery (5/29) 11/03/2015    Past Surgical History:  Procedure Laterality Date  . NO PAST SURGERIES      OB History    Gravida  2   Para  2   Term  2   Preterm      AB      Living  1     SAB      TAB      Ectopic      Multiple  0   Live Births  1            Home Medications    Prior to Admission medications   Medication Sig Start Date End Date Taking? Authorizing Provider  cefdinir (OMNICEF) 300 MG capsule Take 2 capsules (600 mg total) by mouth daily. 09/25/18   Elvina SidleLauenstein, Annaelle Kasel, MD  Fluticasone Propionate Timmothy Sours(XHANCE) 93 MCG/ACT EXHU Place 2 sprays into both nostrils 2 (two) times daily. 05/02/18   Bobbitt, Heywood Ilesalph Carter, MD  ibuprofen (ADVIL,MOTRIN) 600 MG tablet Take 1 tablet (600 mg total) by mouth every 6 (six) hours. 11/04/15   Marlinda MikeBailey, Tanya, CNM  ipratropium (ATROVENT) 0.06 % nasal spray Place 2 sprays into both nostrils 4 (four) times daily. 09/12/18   Cathie HoopsYu, Amy V, PA-C  levocetirizine (XYZAL) 5 MG tablet Take 1 tablet (5 mg total) by mouth every evening. 05/02/18   Bobbitt, Heywood Ilesalph Carter, MD  Olopatadine HCl (PAZEO) 0.7 % SOLN Place 1 drop into both eyes 1 day or 1 dose. 05/02/18   Bobbitt, Heywood Ilesalph Carter, MD    Family History History reviewed. No pertinent family history.  Social History Social History   Tobacco Use  .  Smoking status: Never Smoker  . Smokeless tobacco: Never Used  Substance Use Topics  . Alcohol use: No  . Drug use: No     Allergies   Patient has no known allergies.   Review of Systems Review of Systems  Constitutional: Negative.   HENT: Positive for ear pain and sinus pressure.   Respiratory: Positive for cough.   Gastrointestinal: Negative.   All other systems reviewed and are negative.    Physical Exam Triage Vital Signs ED Triage Vitals [09/25/18 1239]  Enc Vitals Group     BP      Pulse      Resp      Temp      Temp src      SpO2      Weight      Height      Head Circumference      Peak Flow      Pain Score 5     Pain Loc      Pain Edu?      Excl. in GC?    No data found.  Updated Vital Signs BP 118/77 (BP Location: Left Arm)   Pulse 70   Temp 98.3 F (36.8  C) (Oral)   Resp 18   LMP 09/18/2018   SpO2 98%    Physical Exam Vitals signs and nursing note reviewed.  Constitutional:      Appearance: Normal appearance.  HENT:     Head: Normocephalic.     Ears:     Comments: Bilateral serous changes of TM's with fluid behind TM's    Nose: Nose normal.     Mouth/Throat:     Mouth: Mucous membranes are moist.  Eyes:     Conjunctiva/sclera: Conjunctivae normal.  Neck:     Musculoskeletal: Normal range of motion and neck supple.  Cardiovascular:     Rate and Rhythm: Normal rate and regular rhythm.     Pulses: Normal pulses.     Heart sounds: Normal heart sounds.  Pulmonary:     Effort: Pulmonary effort is normal.     Breath sounds: Normal breath sounds.  Musculoskeletal: Normal range of motion.  Skin:    General: Skin is warm and dry.  Neurological:     General: No focal deficit present.     Mental Status: She is alert and oriented to person, place, and time.  Psychiatric:        Mood and Affect: Mood normal.      UC Treatments / Results  Labs (all labs ordered are listed, but only abnormal results are displayed) Labs Reviewed - No data to display  EKG None  Radiology No results found.  Procedures Procedures (including critical care time)  Medications Ordered in UC Medications - No data to display  Initial Impression / Assessment and Plan / UC Course  I have reviewed the triage vital signs and the nursing notes.  Pertinent labs & imaging results that were available during my care of the patient were reviewed by me and considered in my medical decision making (see chart for details).    Final Clinical Impressions(s) / UC Diagnoses   Final diagnoses:  Non-recurrent acute serous otitis media of both ears  Chronic maxillary sinusitis     Discharge Instructions     There is an accumulation of cloudy fluid behind the ear drums that should gradually clear with the antibiotic    ED Prescriptions    Medication  Sig Dispense Auth. Provider  cefdinir (OMNICEF) 300 MG capsule Take 2 capsules (600 mg total) by mouth daily. 20 capsule Elvina Sidle, MD     Controlled Substance Prescriptions [place] Controlled Substance Registry consulted? Not Applicable   Elvina Sidle, MD 09/25/18 1251

## 2018-09-25 NOTE — Discharge Instructions (Addendum)
There is an accumulation of cloudy fluid behind the ear drums that should gradually clear with the antibiotic

## 2018-10-20 ENCOUNTER — Other Ambulatory Visit: Payer: Self-pay | Admitting: Unknown Physician Specialty

## 2018-10-20 ENCOUNTER — Ambulatory Visit
Admission: RE | Admit: 2018-10-20 | Discharge: 2018-10-20 | Disposition: A | Discharge status: Home / Self Care | Payer: BLUE CROSS/BLUE SHIELD | Source: Ambulatory Visit | Attending: Unknown Physician Specialty | Admitting: Unknown Physician Specialty

## 2018-10-20 DIAGNOSIS — J329 Chronic sinusitis, unspecified: Secondary | ICD-10-CM | POA: Insufficient documentation

## 2018-10-20 DIAGNOSIS — H6980 Other specified disorders of Eustachian tube, unspecified ear: Secondary | ICD-10-CM | POA: Diagnosis not present

## 2018-10-20 DIAGNOSIS — H903 Sensorineural hearing loss, bilateral: Secondary | ICD-10-CM | POA: Diagnosis not present

## 2018-11-28 ENCOUNTER — Other Ambulatory Visit: Payer: Self-pay

## 2018-11-28 ENCOUNTER — Ambulatory Visit
Admission: EM | Admit: 2018-11-28 | Discharge: 2018-11-28 | Disposition: A | Discharge status: Home / Self Care | Payer: BC Managed Care – PPO | Attending: Physician Assistant | Admitting: Physician Assistant

## 2018-11-28 DIAGNOSIS — R1084 Generalized abdominal pain: Secondary | ICD-10-CM | POA: Diagnosis not present

## 2018-11-28 LAB — POCT URINALYSIS DIP (MANUAL ENTRY)
Bilirubin, UA: NEGATIVE
Blood, UA: NEGATIVE
Glucose, UA: NEGATIVE mg/dL
Ketones, POC UA: NEGATIVE mg/dL
Leukocytes, UA: NEGATIVE
Nitrite, UA: NEGATIVE
Protein Ur, POC: NEGATIVE mg/dL
Spec Grav, UA: 1.01 (ref 1.010–1.025)
Urobilinogen, UA: 0.2 E.U./dL
pH, UA: 7 (ref 5.0–8.0)

## 2018-11-28 LAB — POCT URINE PREGNANCY: Preg Test, Ur: NEGATIVE

## 2018-11-28 MED ORDER — DICYCLOMINE HCL 20 MG PO TABS: 20.0000 mg | ORAL_TABLET | Freq: Two times a day (BID) | ORAL | 0 refills | Status: DC

## 2018-11-28 NOTE — ED Provider Notes (Signed)
[facility]    CSN: [id number] Arrival date & time: 11/28/18  1518     History   Chief Complaint Chief Complaint  Patient presents with  . Abdominal Pain    HPI Lauren Weaver is a 28 y.o. female.   28 year old female comes in for evaluation of 2 day history of abdominal pain. States pain first started to bilateral lower abdomen that was pretty severe, but eased off. Since then, she has had cramping to different locations of the abdomen, with symptoms worsening after eating and slightly relieves after bowel movement. Nausea without vomiting. Has had looser stools but no diarrhea. Has mild urinary frequency without dysuria, hematuria. Mild vaginal discharge without itching, pain. Sexually active with one female partner, no birth control use. LMP 11/03/2018     Past Medical History:  Diagnosis Date  . Medical history non-contributory   . Postpartum care following vaginal delivery (5/29) 11/03/2015    Patient Active Problem List   Diagnosis Date Noted  . Allergic rhinitis 05/02/2018  . Allergic conjunctivitis 05/02/2018  . Active labor 11/03/2015  . Term pregnancy delivered 11/03/2015  . Postpartum care following vaginal delivery (5/29) 11/03/2015    Past Surgical History:  Procedure Laterality Date  . NO PAST SURGERIES      OB History    Gravida  2   Para  2   Term  2   Preterm      AB      Living  1     SAB      TAB      Ectopic      Multiple  0   Live Births  1            Home Medications    Prior to Admission medications   Medication Sig Start Date End Date Taking? Authorizing Provider  dicyclomine (BENTYL) 20 MG tablet Take 1 tablet (20 mg total) by mouth 2 (two) times daily. 11/28/18   Cathie HoopsYu, Emmy Keng V, PA-C  ibuprofen (ADVIL,MOTRIN) 600 MG tablet Take 1 tablet (600 mg total) by mouth every 6 (six) hours. 11/04/15   Marlinda MikeBailey, Tanya, CNM    Family History No family history on file.  Social History Social History   Tobacco  Use  . Smoking status: Never Smoker  . Smokeless tobacco: Never Used  Substance Use Topics  . Alcohol use: No  . Drug use: No     Allergies   Patient has no known allergies.   Review of Systems Review of Systems  Reason unable to perform ROS: See HPI as above.     Physical Exam Triage Vital Signs ED Triage Vitals  Enc Vitals Group     BP 11/28/18 1530 (!) 141/89     Pulse Rate 11/28/18 1530 72     Resp 11/28/18 1530 20     Temp 11/28/18 1530 98.6 F (37 C)     Temp Source 11/28/18 1530 Oral     SpO2 11/28/18 1530 98 %     Weight --      Height --      Head Circumference --      Peak Flow --      Pain Score 11/28/18 1532 8     Pain Loc --      Pain Edu? --      Excl. in [place]? --    No data found.  Updated Vital Signs BP (!) 141/89 (BP Location: Left Arm)   Pulse 72  Temp 98.6 F (37 C) (Oral)   Resp 20   LMP 11/07/2018   SpO2 98%   Physical Exam Constitutional:      General: She is not in acute distress.    Appearance: She is well-developed. She is not ill-appearing, toxic-appearing or diaphoretic.  HENT:     Head: Normocephalic and atraumatic.  Eyes:     Conjunctiva/sclera: Conjunctivae normal.     Pupils: Pupils are equal, round, and reactive to light.  Cardiovascular:     Rate and Rhythm: Normal rate and regular rhythm.     Heart sounds: Normal heart sounds. No murmur. No friction rub. No gallop.   Pulmonary:     Effort: Pulmonary effort is normal.     Breath sounds: Normal breath sounds. No wheezing or rales.  Abdominal:     General: Bowel sounds are increased.     Palpations: Abdomen is soft.     Tenderness: There is no abdominal tenderness. There is no right CVA tenderness, left CVA tenderness, guarding or rebound.  Skin:    General: Skin is warm and dry.  Neurological:     Mental Status: She is alert and oriented to person, place, and time.  Psychiatric:        Behavior: Behavior normal.        Judgment: Judgment normal.      UC  Treatments / Results  Labs (all labs ordered are listed, but only abnormal results are displayed) Labs Reviewed  POCT URINE PREGNANCY  POCT URINALYSIS DIP (MANUAL ENTRY)    EKG None  Radiology No results found.  Procedures Procedures (including critical care time)  Medications Ordered in UC Medications - No data to display  Initial Impression / Assessment and Plan / UC Course  I have reviewed the triage vital signs and the nursing notes.  Pertinent labs & imaging results that were available during my care of the patient were reviewed by me and considered in my medical decision making (see chart for details).    Urine negative for infection, pregnancy. Will start bentyl for abdominal cramping. Push fluids. Bland diet, advance as tolerated. Continue to monitor symptoms. Return precautions given. Patient expresses understanding and agrees to plan.  Final Clinical Impressions(s) / UC Diagnoses   Final diagnoses:  Generalized abdominal pain   ED Prescriptions    Medication Sig Dispense Auth. Provider   dicyclomine (BENTYL) 20 MG tablet Take 1 tablet (20 mg total) by mouth 2 (two) times daily. 20 tablet Tobin Chad, [place] 11/28/18 1621

## 2018-11-28 NOTE — ED Triage Notes (Signed)
Pt c/o severe lower abdominal pain that started yesterday, states not as bad now but still cramping with nausea and lethargic.

## 2018-11-28 NOTE — Discharge Instructions (Signed)
Urine negative for infection, blood, pregnancy.  Take Bentyl for abdominal cramping. Keep hydrated, urine should be clear to pale yellow in color. Bland diet, advance as tolerated. Monitor for any worsening of symptoms, nausea or vomiting not controlled by medication, worsening abdominal pain, fever, follow-up for reevaluation.

## 2018-12-26 ENCOUNTER — Ambulatory Visit
Admission: EM | Admit: 2018-12-26 | Discharge: 2018-12-26 | Disposition: A | Discharge status: Home / Self Care | Payer: BC Managed Care – PPO | Attending: Physician Assistant | Admitting: Physician Assistant

## 2018-12-26 ENCOUNTER — Other Ambulatory Visit: Payer: Self-pay

## 2018-12-26 DIAGNOSIS — Z711 Person with feared health complaint in whom no diagnosis is made: Secondary | ICD-10-CM | POA: Diagnosis not present

## 2018-12-26 DIAGNOSIS — Z20828 Contact with and (suspected) exposure to other viral communicable diseases: Secondary | ICD-10-CM | POA: Diagnosis not present

## 2018-12-26 NOTE — ED Provider Notes (Signed)
[facility]    CSN: [id number] Arrival date & time: 12/26/18  1203      History   Chief Complaint Chief Complaint  Patient presents with  . COVID testing    HPI Lauren Weaver is a 28 y.o. female.   28 year old female comes in with family for COVID testing.  They are traveling to [place] in 3 days.  Per travel policy, they are to obtain COVID testing 72 hours prior to departure.  Upon arrival, they are to quarantine until testing results return.  Denies recent travel.  Denies sick contacts/COVID contact.  Patient is asymptomatic.  Denies fever, chills, body aches.  Denies abdominal pain, nausea, vomiting.  Denies cough, congestion, sore throat.  Denies loss of taste or smell.     Past Medical History:  Diagnosis Date  . Medical history non-contributory   . Postpartum care following vaginal delivery (5/29) 11/03/2015    Patient Active Problem List   Diagnosis Date Noted  . Allergic rhinitis 05/02/2018  . Allergic conjunctivitis 05/02/2018  . Active labor 11/03/2015  . Term pregnancy delivered 11/03/2015  . Postpartum care following vaginal delivery (5/29) 11/03/2015    Past Surgical History:  Procedure Laterality Date  . NO PAST SURGERIES      OB History    Gravida  2   Para  2   Term  2   Preterm      AB      Living  1     SAB      TAB      Ectopic      Multiple  0   Live Births  1            Home Medications    Prior to Admission medications   Medication Sig Start Date End Date Taking? Authorizing Provider  dicyclomine (BENTYL) 20 MG tablet Take 1 tablet (20 mg total) by mouth 2 (two) times daily. 11/28/18   Tasia Catchings,  V, PA-C  ibuprofen (ADVIL,MOTRIN) 600 MG tablet Take 1 tablet (600 mg total) by mouth every 6 (six) hours. 11/04/15   Artelia Laroche, CNM    Family History No family history on file.  Social History Social History   Tobacco Use  . Smoking status: Never Smoker  . Smokeless tobacco: Never Used   Substance Use Topics  . Alcohol use: No  . Drug use: No     Allergies   Patient has no known allergies.   Review of Systems Review of Systems  Reason unable to perform ROS: See HPI as above.     Physical Exam Triage Vital Signs ED Triage Vitals [12/26/18 1225]  Enc Vitals Group     BP 117/69     Pulse Rate 76     Resp 16     Temp 97.9 F (36.6 C)     Temp Source Oral     SpO2 98 %     Weight      Height      Head Circumference      Peak Flow      Pain Score      Pain Loc      Pain Edu?      Excl. in [place]?    No data found.  Updated Vital Signs BP 117/69 (BP Location: Left Arm)   Pulse 76   Temp 97.9 F (36.6 C) (Oral)   Resp 16   SpO2 98%   Physical Exam Constitutional:      General:  She is not in acute distress.    Appearance: She is well-developed. She is not diaphoretic.  HENT:     Head: Normocephalic and atraumatic.  Eyes:     Conjunctiva/sclera: Conjunctivae normal.     Pupils: Pupils are equal, round, and reactive to light.  Pulmonary:     Effort: Pulmonary effort is normal. No respiratory distress.  Neurological:     Mental Status: She is alert and oriented to person, place, and time.      UC Treatments / Results  Labs (all labs ordered are listed, but only abnormal results are displayed) Labs Reviewed - No data to display  EKG   Radiology No results found.  Procedures Procedures (including critical care time)  Medications Ordered in UC Medications - No data to display  Initial Impression / Assessment and Plan / UC Course  I have reviewed the triage vital signs and the nursing notes.  Pertinent labs & imaging results that were available during my care of the patient were reviewed by me and considered in my medical decision making (see chart for details).    COVID testing ordered.  To continue guidelines for travel policy.  Discussed if develop symptoms, will need retesting.  Final Clinical Impressions(s) / UC Diagnoses    Final diagnoses:  Worried well    ED Prescriptions    None        Belinda FisherYu,  V, PA-C 12/26/18 1304

## 2018-12-26 NOTE — Discharge Instructions (Addendum)
COVID test ordered for travel clearance. Continue guidelines provided per travel policy. If develop symptoms, will need retesting.  °

## 2018-12-26 NOTE — ED Triage Notes (Signed)
States needs COVID testing for travel, denies any concerns

## 2018-12-28 ENCOUNTER — Encounter (HOSPITAL_COMMUNITY): Payer: Self-pay

## 2018-12-28 LAB — NOVEL CORONAVIRUS, NAA: SARS-CoV-2, NAA: NOT DETECTED

## 2019-01-16 DIAGNOSIS — Z3009 Encounter for other general counseling and advice on contraception: Secondary | ICD-10-CM | POA: Diagnosis not present

## 2019-01-16 DIAGNOSIS — N926 Irregular menstruation, unspecified: Secondary | ICD-10-CM | POA: Diagnosis not present

## 2019-01-25 DIAGNOSIS — N83292 Other ovarian cyst, left side: Secondary | ICD-10-CM | POA: Diagnosis not present

## 2019-01-25 DIAGNOSIS — Z30430 Encounter for insertion of intrauterine contraceptive device: Secondary | ICD-10-CM | POA: Diagnosis not present

## 2019-04-02 DIAGNOSIS — Z01419 Encounter for gynecological examination (general) (routine) without abnormal findings: Secondary | ICD-10-CM | POA: Diagnosis not present

## 2019-04-02 DIAGNOSIS — N926 Irregular menstruation, unspecified: Secondary | ICD-10-CM | POA: Diagnosis not present

## 2019-04-02 DIAGNOSIS — R7989 Other specified abnormal findings of blood chemistry: Secondary | ICD-10-CM | POA: Diagnosis not present

## 2019-04-02 DIAGNOSIS — N83209 Unspecified ovarian cyst, unspecified side: Secondary | ICD-10-CM | POA: Diagnosis not present

## 2019-04-02 DIAGNOSIS — Z6823 Body mass index (BMI) 23.0-23.9, adult: Secondary | ICD-10-CM | POA: Diagnosis not present

## 2019-09-25 ENCOUNTER — Other Ambulatory Visit: Payer: Self-pay

## 2019-09-25 ENCOUNTER — Encounter: Payer: Self-pay | Admitting: Emergency Medicine

## 2019-09-25 ENCOUNTER — Ambulatory Visit
Admission: EM | Admit: 2019-09-25 | Discharge: 2019-09-25 | Disposition: A | Discharge status: Home / Self Care | Payer: BC Managed Care – PPO

## 2019-09-25 DIAGNOSIS — M25541 Pain in joints of right hand: Secondary | ICD-10-CM | POA: Diagnosis not present

## 2019-09-25 DIAGNOSIS — M25442 Effusion, left hand: Secondary | ICD-10-CM

## 2019-09-25 MED ORDER — NAPROXEN 500 MG PO TABS: 500.0000 mg | ORAL_TABLET | Freq: Two times a day (BID) | ORAL | 0 refills | Status: DC

## 2019-09-25 NOTE — Discharge Instructions (Signed)
Recommend RICE: rest, ice, compression, elevation as needed for pain.   Cold therapy (ice packs) can be used to help swelling both after injury and after prolonged use of areas of chronic pain/aches.  For pain: naproxen, may take tylenol if needed

## 2019-09-25 NOTE — ED Triage Notes (Signed)
Left hand with pain and swelling to middle finger, aching.  Pain extends into hand.  Unable to make a fist or grip.  This has been going on for 2 weeks.  Denies any known injury.  Patient is right handed.   Patient does workout training with hand weights, typing.

## 2019-09-25 NOTE — ED Provider Notes (Signed)
[facility]    CSN: [id number] Arrival date & time: 09/25/19  3474      History   Chief Complaint Chief Complaint  Patient presents with  . Hand Problem    HPI Lauren Weaver is a 29 y.o. right-handed female present for left hand pain and swelling daily over middle finger.  Describes aching pain.  States is worse when extending her fingers.  Unable to make a full fist or grip firmly.  States is been ongoing for the last 2 weeks.  No known injury or trauma to the area.  Numbness, deformity.  Has not tried nothing for this.   Past Medical History:  Diagnosis Date  . Medical history non-contributory   . Postpartum care following vaginal delivery (5/29) 11/03/2015    Patient Active Problem List   Diagnosis Date Noted  . Allergic rhinitis 05/02/2018  . Allergic conjunctivitis 05/02/2018  . Active labor 11/03/2015  . Term pregnancy delivered 11/03/2015  . Postpartum care following vaginal delivery (5/29) 11/03/2015    Past Surgical History:  Procedure Laterality Date  . NO PAST SURGERIES      OB History    Gravida  2   Para  2   Term  2   Preterm      AB      Living  1     SAB      TAB      Ectopic      Multiple  0   Live Births  1            Home Medications    Prior to Admission medications   Medication Sig Start Date End Date Taking? Authorizing Provider  Iron Combinations (IRON COMPLEX PO) Take by mouth.   Yes [provider]  dicyclomine (BENTYL) 20 MG tablet Take 1 tablet (20 mg total) by mouth 2 (two) times daily. 11/28/18   Tasia Catchings, Amy V, PA-C  ibuprofen (ADVIL,MOTRIN) 600 MG tablet Take 1 tablet (600 mg total) by mouth every 6 (six) hours. 11/04/15   Artelia Laroche, CNM  naproxen (NAPROSYN) 500 MG tablet Take 1 tablet (500 mg total) by mouth 2 (two) times daily. 09/25/19   Hall-Potvin, [place], PA-C    Family History History reviewed. No pertinent family history.  Social History Social History   Tobacco Use    . Smoking status: Never Smoker  . Smokeless tobacco: Never Used  Substance Use Topics  . Alcohol use: No  . Drug use: No     Allergies   Patient has no known allergies.   Review of Systems As per HPI   Physical Exam Triage Vital Signs ED Triage Vitals [09/25/19 0926]  Enc Vitals Group     BP 117/77     Pulse Rate 67     Resp 16     Temp 98.2 F (36.8 C)     Temp Source Oral     SpO2 98 %     Weight      Height      Head Circumference      Peak Flow      Pain Score      Pain Loc      Pain Edu?      Excl. in [place]?    No data found.  Updated Vital Signs BP 117/77 (BP Location: Right Arm)   Pulse 67   Temp 98.2 F (36.8 C) (Oral)   Resp 16   LMP 07/28/2019   SpO2 98%  Visual Acuity Right Eye Distance:   Left Eye Distance:   Bilateral Distance:    Right Eye Near:   Left Eye Near:    Bilateral Near:     Physical Exam Constitutional:      General: She is not in acute distress. HENT:     Head: Normocephalic and atraumatic.  Eyes:     General: No scleral icterus.    Pupils: Pupils are equal, round, and reactive to light.  Cardiovascular:     Rate and Rhythm: Normal rate.  Pulmonary:     Effort: Pulmonary effort is normal.  Musculoskeletal:        General: Swelling and tenderness present.     Comments: Mild swelling tenderness of left hand, third digit at MCP.  No crepitus, deformity.  ROM & strength slightly decreased second to pain.  NVI  Skin:    Coloration: Skin is not jaundiced or pale.  Neurological:     Mental Status: She is alert and oriented to person, place, and time.      UC Treatments / Results  Labs (all labs ordered are listed, but only abnormal results are displayed) Labs Reviewed - No data to display  EKG   Radiology No results found.  Procedures Procedures (including critical care time)  Medications Ordered in UC Medications - No data to display  Initial Impression / Assessment and Plan / UC Course  I have  reviewed the triage vital signs and the nursing notes.  Pertinent labs & imaging results that were available during my care of the patient were reviewed by me and considered in my medical decision making (see chart for details).     Patient appears well in office, no injury or inciting event: Radiography deferred.  Will treat supportively as outlined below, patient follow-up with Ortho for persistent or worsening symptoms.  No evidence of trigger finger, severe or systemic arthritis at this time.  Return precautions discussed, patient verbalized understanding and is agreeable to plan. Final Clinical Impressions(s) / UC Diagnoses   Final diagnoses:  Swelling of finger joint of left hand  Pain involving joint of finger of right hand     Discharge Instructions     Recommend RICE: rest, ice, compression, elevation as needed for pain.   Cold therapy (ice packs) can be used to help swelling both after injury and after prolonged use of areas of chronic pain/aches.  For pain: naproxen, may take tylenol if needed    ED Prescriptions    Medication Sig Dispense Auth. Provider   naproxen (NAPROSYN) 500 MG tablet Take 1 tablet (500 mg total) by mouth 2 (two) times daily. 30 tablet Hall-Potvin, [place], PA-C     PDMP not reviewed this encounter.   Hall-Potvin, Lauren Weaver, [place] 09/25/19 1018

## 2019-10-02 DIAGNOSIS — M79642 Pain in left hand: Secondary | ICD-10-CM | POA: Diagnosis not present

## 2019-10-24 DIAGNOSIS — M79642 Pain in left hand: Secondary | ICD-10-CM | POA: Diagnosis not present

## 2020-01-31 IMAGING — CR PARANASAL SINUSES - COMPLETE 3 + VIEW
1 series · 4 of 4 positions shown · non-contrast
Comparison: None.

CLINICAL DATA: Chronic sinusitis.  Chronic pain in the cheeks.

EXAM:
PARANASAL SINUSES - COMPLETE 3 + VIEW

[Series 1: dg sinuses complete · 0.14mm/px · 4 of 4 slices shown]
[im 1/4]
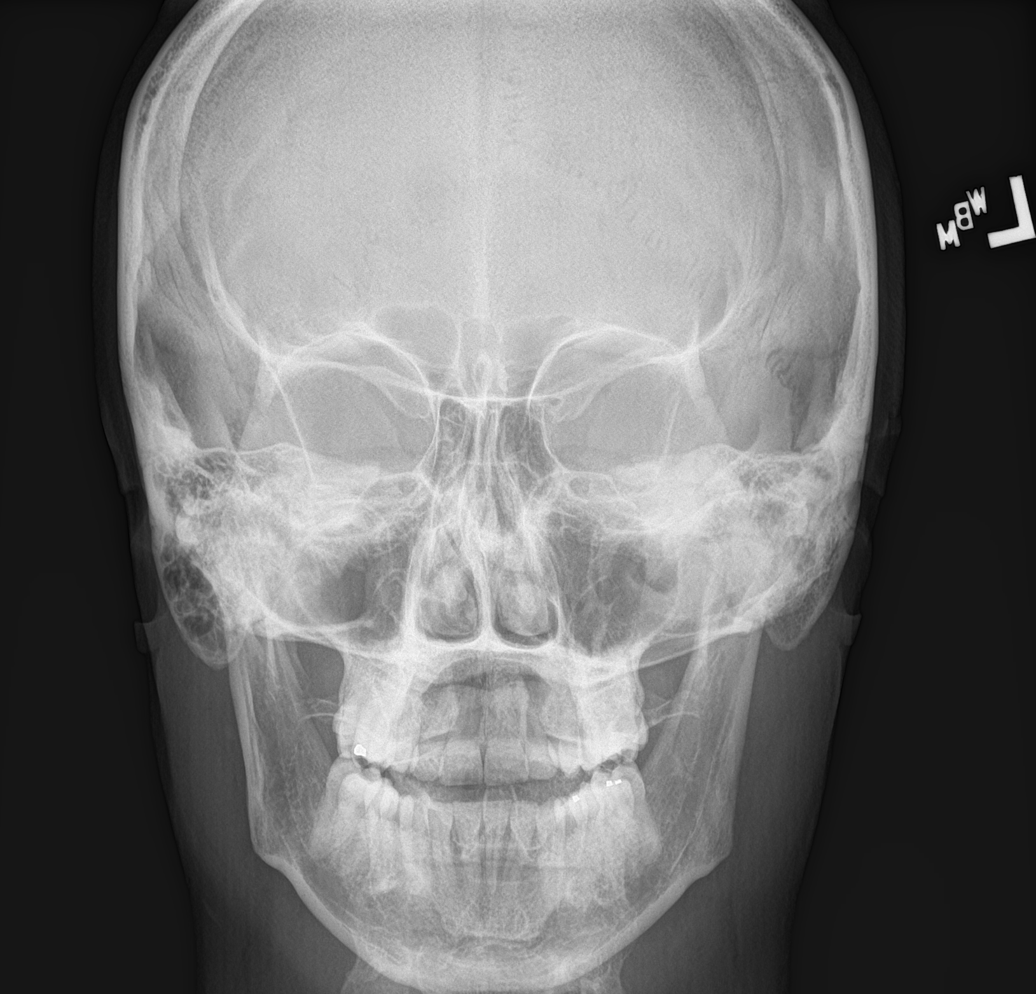
[im 2/4]
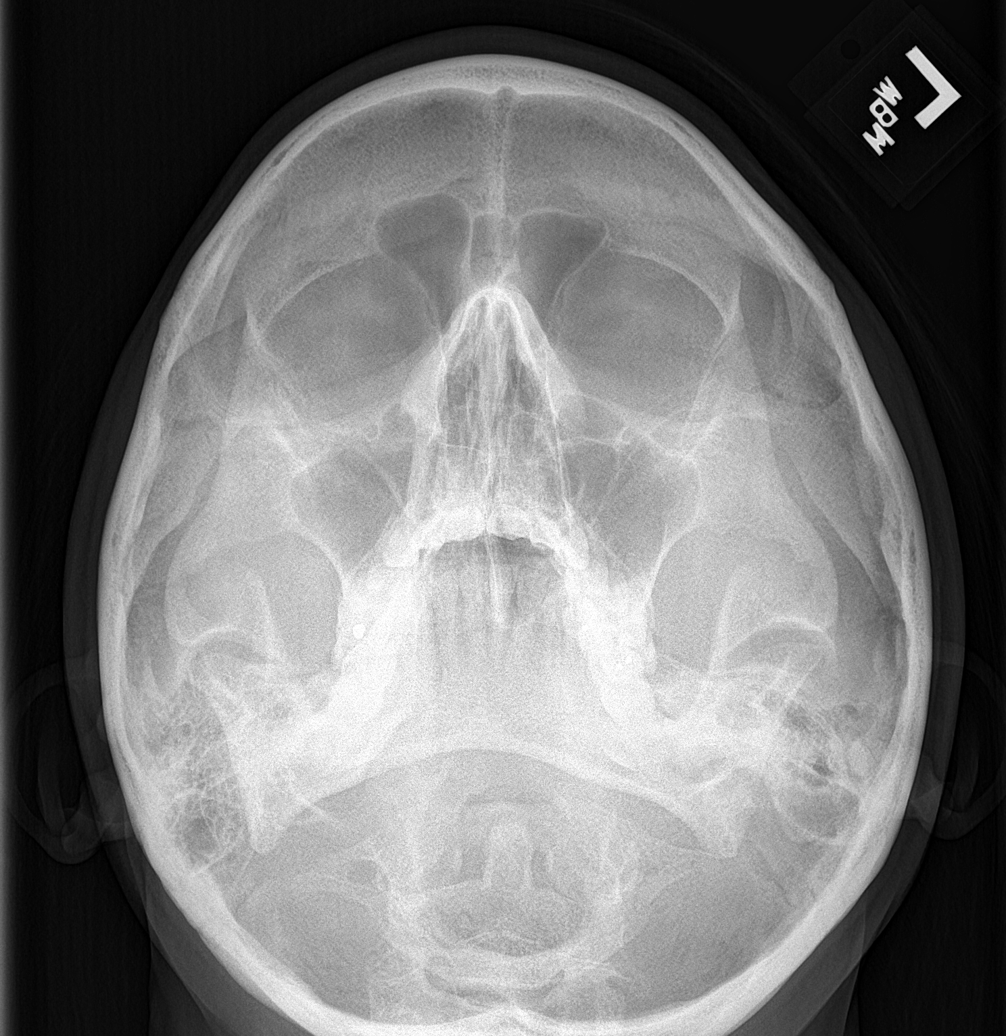
[im 3/4]
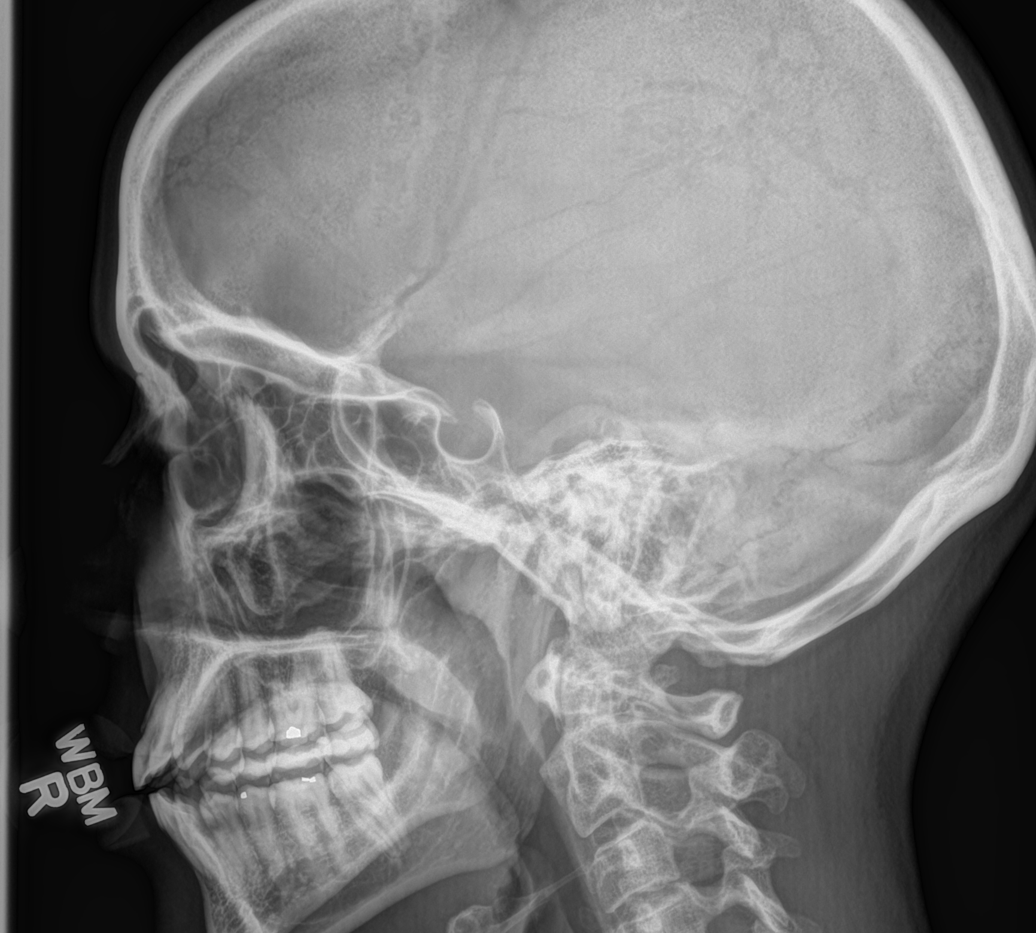
[im 4/4]
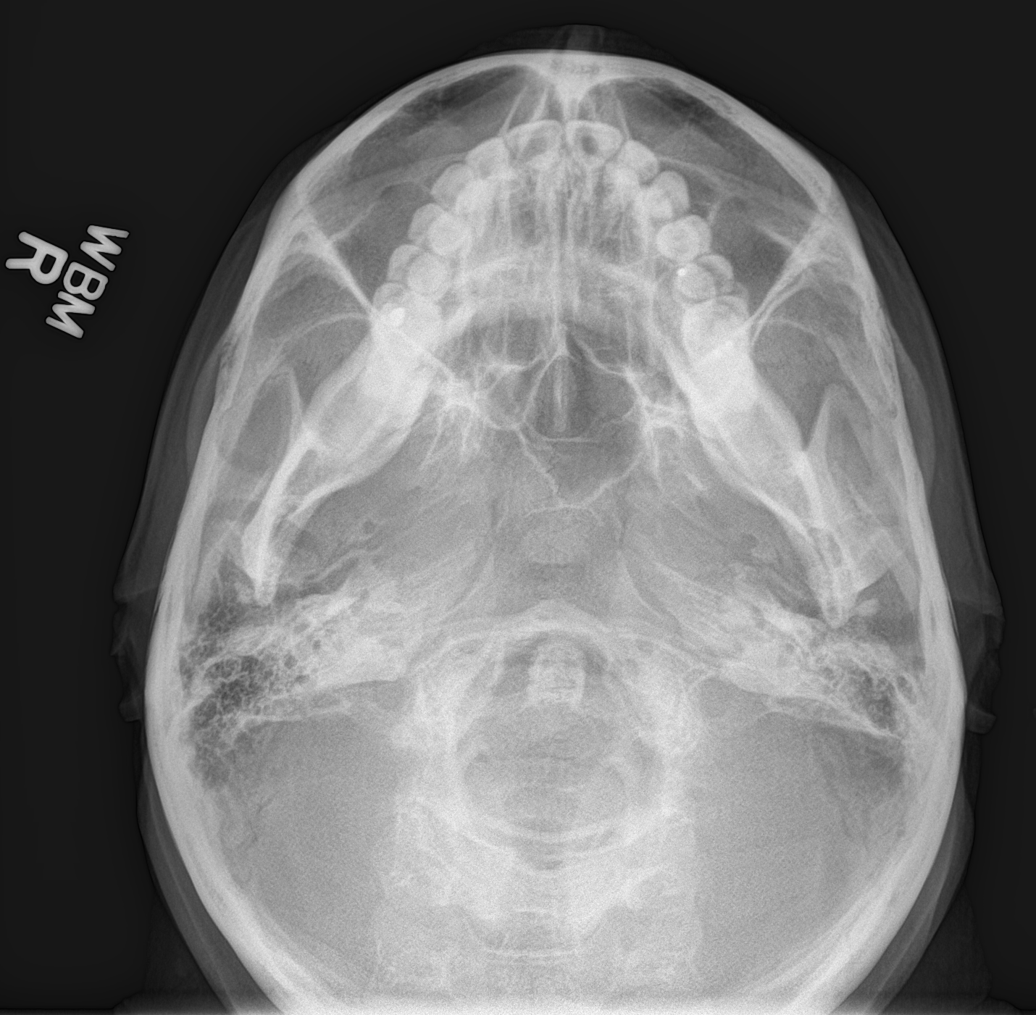

[4 of 4 positions shown; findings below may reference images not displayed]

FINDINGS: The paranasal sinus are aerated. There is no evidence of sinus
opacification air-fluid levels or mucosal thickening. No significant
bone abnormalities are seen.
IMPRESSION: Negative sinus radiographs. CT could be used for further detail if
clinically indicated.

## 2020-02-21 DIAGNOSIS — D229 Melanocytic nevi, unspecified: Secondary | ICD-10-CM | POA: Diagnosis not present

## 2020-02-27 ENCOUNTER — Telehealth: Payer: Self-pay

## 2020-02-27 NOTE — Telephone Encounter (Signed)
Copied from CRM [id number]. Topic: Appointment Scheduling - Scheduling Inquiry for Clinic >> Feb 27, 2020 11:45 AM Randol Kern wrote: Reason for CRM: Pt wants to reschedule her new pt appt tomorrow, please advise  Best contact: [phone]

## 2020-02-27 NOTE — Telephone Encounter (Signed)
Rescheduled to 04/24/2020   Thanks,   -Vernona Rieger

## 2020-02-28 ENCOUNTER — Ambulatory Visit: Payer: BLUE CROSS/BLUE SHIELD | Admitting: Family Medicine

## 2020-04-17 ENCOUNTER — Ambulatory Visit
Admission: EM | Admit: 2020-04-17 | Discharge: 2020-04-17 | Disposition: A | Discharge status: Home / Self Care | Payer: BC Managed Care – PPO | Attending: Emergency Medicine | Admitting: Emergency Medicine

## 2020-04-17 ENCOUNTER — Encounter: Payer: Self-pay | Admitting: Emergency Medicine

## 2020-04-17 ENCOUNTER — Other Ambulatory Visit: Payer: Self-pay

## 2020-04-17 DIAGNOSIS — H9202 Otalgia, left ear: Secondary | ICD-10-CM | POA: Diagnosis not present

## 2020-04-17 MED ORDER — CETIRIZINE HCL 10 MG PO TABS: 10.0000 mg | ORAL_TABLET | Freq: Every day | ORAL | 0 refills | Status: AC

## 2020-04-17 MED ORDER — FLUTICASONE PROPIONATE 50 MCG/ACT NA SUSP: 1.0000 | Freq: Every day | NASAL | 0 refills | Status: DC

## 2020-04-17 NOTE — ED Provider Notes (Signed)
[facility]    CSN: [id number] Arrival date & time: 04/17/20  1014      History   Chief Complaint Chief Complaint  Patient presents with  . Otalgia    HPI Lauren Weaver is a 29 y.o. female  Lauren Weaver is a 29 y.o. female who presents for evaluation of left ear pain. Symptoms have been present for 2 weeks. She also notes drainage in the left ear. She does not have a history of ear infections. She does not have a history of recent swimming.  The patient's history has been marked as reviewed and updated as appropriate.       Past Medical History:  Diagnosis Date  . Medical history non-contributory   . Postpartum care following vaginal delivery (5/29) 11/03/2015    Patient Active Problem List   Diagnosis Date Noted  . Allergic rhinitis 05/02/2018  . Allergic conjunctivitis 05/02/2018  . Active labor 11/03/2015  . Term pregnancy delivered 11/03/2015  . Postpartum care following vaginal delivery (5/29) 11/03/2015    Past Surgical History:  Procedure Laterality Date  . NO PAST SURGERIES      OB History    Gravida  2   Para  2   Term  2   Preterm      AB      Living  1     SAB      TAB      Ectopic      Multiple  0   Live Births  1            Home Medications    Prior to Admission medications   Medication Sig Start Date End Date Taking? Authorizing Provider  cetirizine (ZYRTEC ALLERGY) 10 MG tablet Take 1 tablet (10 mg total) by mouth daily. 04/17/20   Hall-Potvin, [place], PA-C  fluticasone (FLONASE) 50 MCG/ACT nasal spray Place 1 spray into both nostrils daily. 04/17/20   Hall-Potvin, [place], PA-C  ibuprofen (ADVIL,MOTRIN) 600 MG tablet Take 1 tablet (600 mg total) by mouth every 6 (six) hours. 11/04/15   Marlinda Mike., CNM  naproxen (NAPROSYN) 500 MG tablet Take 1 tablet (500 mg total) by mouth 2 (two) times daily. 09/25/19   Hall-Potvin, [place], PA-C  dicyclomine (BENTYL) 20 MG tablet Take 1 tablet (20 mg  total) by mouth 2 (two) times daily. 11/28/18 04/17/20  Belinda Fisher, PA-C    Family History History reviewed. No pertinent family history.  Social History Social History   Tobacco Use  . Smoking status: Never Smoker  . Smokeless tobacco: Never Used  Vaping Use  . Vaping Use: Never used  Substance Use Topics  . Alcohol use: No  . Drug use: No     Allergies   Patient has no known allergies.   Review of Systems Review of Systems  Constitutional: Negative for fatigue and fever.  HENT: Positive for congestion, ear discharge and ear pain. Negative for dental problem, facial swelling, hearing loss, sinus pain, sore throat, trouble swallowing and voice change.   Eyes: Negative for photophobia, pain, discharge, itching and visual disturbance.  Respiratory: Negative for cough and shortness of breath.   Cardiovascular: Negative for chest pain and palpitations.  Gastrointestinal: Negative for diarrhea and vomiting.  Musculoskeletal: Negative for arthralgias and myalgias.  Neurological: Negative for dizziness and headaches.     Physical Exam Triage Vital Signs ED Triage Vitals  Enc Vitals Group     BP      Pulse  Resp      Temp      Temp src      SpO2      Weight      Height      Head Circumference      Peak Flow      Pain Score      Pain Loc      Pain Edu?      Excl. in [place]?    No data found.  Updated Vital Signs BP 126/83 (BP Location: Right Arm)   Pulse 82   Temp 98.6 F (37 C) (Oral)   Resp 16   Ht 5\' 3"  (1.6 m)   Wt 130 lb (59 kg)   SpO2 99%   BMI 23.03 kg/m   Visual Acuity Right Eye Distance:   Left Eye Distance:   Bilateral Distance:    Right Eye Near:   Left Eye Near:    Bilateral Near:     Physical Exam Constitutional:      General: She is not in acute distress. HENT:     Head: Normocephalic and atraumatic.     Jaw: There is normal jaw occlusion. No tenderness or pain on movement.     Right Ear: Hearing, tympanic membrane, ear canal and  external ear normal. No tenderness. No mastoid tenderness.     Left Ear: Hearing, ear canal and external ear normal. No tenderness. No mastoid tenderness.     Ears:     Comments: Negative tragal tenderness bilaterally.  L TM w/ retraction.  Bony landmarks appropriate.  No FB, discharge    Nose: No nasal deformity, septal deviation or nasal tenderness.     Right Turbinates: Not swollen or pale.     Left Turbinates: Not swollen or pale.     Right Sinus: No maxillary sinus tenderness or frontal sinus tenderness.     Left Sinus: No maxillary sinus tenderness or frontal sinus tenderness.     Mouth/Throat:     Lips: Pink. No lesions.     Mouth: Mucous membranes are moist. No injury.     Pharynx: Oropharynx is clear. Uvula midline. No posterior oropharyngeal erythema or uvula swelling.     Comments: no tonsillar exudate or hypertrophy Cardiovascular:     Rate and Rhythm: Normal rate.  Pulmonary:     Effort: Pulmonary effort is normal.  Musculoskeletal:     Cervical back: Normal range of motion and neck supple. No tenderness. No muscular tenderness.  Lymphadenopathy:     Cervical: No cervical adenopathy.  Neurological:     Mental Status: She is alert and oriented to person, place, and time.      UC Treatments / Results  Labs (all labs ordered are listed, but only abnormal results are displayed) Labs Reviewed - No data to display  EKG   Radiology No results found.  Procedures Procedures (including critical care time)  Medications Ordered in UC Medications - No data to display  Initial Impression / Assessment and Plan / UC Course  I have reviewed the triage vital signs and the nursing notes.  Pertinent labs & imaging results that were available during my care of the patient were reviewed by me and considered in my medical decision making (see chart for details).     Likely ET tube dysfunction 2/2 seasonal allergies.  Will treat supportively as below, f/u w/ ENT for  persistent/worsening symptoms.  Return precautions discussed, pt verbalized understanding and is agreeable to plan. Final Clinical Impressions(s) / UC  Diagnoses   Final diagnoses:  Otalgia of left ear   Discharge Instructions   None    ED Prescriptions    Medication Sig Dispense Auth. Provider   cetirizine (ZYRTEC ALLERGY) 10 MG tablet Take 1 tablet (10 mg total) by mouth daily. 30 tablet Hall-Potvin, [place], PA-C   fluticasone (FLONASE) 50 MCG/ACT nasal spray Place 1 spray into both nostrils daily. 16 g Hall-Potvin, [place], PA-C     PDMP not reviewed this encounter.   Hall-Potvin, [place], [place] 04/17/20 1129

## 2020-04-17 NOTE — ED Triage Notes (Signed)
Pt said x 2 weeks has been having left ear pain. Pt said some drainage at night. Pt said ear feels warm at night and full when she lays down. Painful to chew and open her mouth.

## 2020-04-23 NOTE — Progress Notes (Signed)
New patient visit   Patient: Lauren Weaver   DOB: [birth year]   29 y.o. Female  MRN: [id number] Visit Date: 04/24/2020  Today's healthcare provider: Shirlee Latch, MD   Chief Complaint  Patient presents with  . New Patient (Initial Visit)   Subjective    Lauren Weaver is a 29 y.o. female who presents today as a new patient to establish care.  HPI  Patient reports pap is up to date, patient of Arlana Lindau at [facility].  1 yr ago was having issues with OCPs and told that thyroid was borderline abnormal.  Has gained 15-20 lbs in 1 year. Would like this rechecked.  Past Medical History:  Diagnosis Date  . Allergy   . Medical history non-contributory   . Postpartum care following vaginal delivery (5/29) 11/03/2015   Past Surgical History:  Procedure Laterality Date  . NO PAST SURGERIES     Family Status  Relation Name Status  . Mother  Alive  . Father  Alive  . Sister  Alive  . Brother  Alive  . Daughter  Alive  . Son  Alive  . Sister  Alive  . Sister  Alive  . Brother  Alive  . Brother  Alive  . Brother  Alive  . Brother  Alive  . Neg Hx  (Not Specified)   Family History  Problem Relation Age of Onset  . Healthy Mother   . Healthy Father   . Healthy Sister   . Healthy Brother   . Healthy Daughter   . Healthy Son   . Healthy Sister   . Healthy Sister   . Healthy Brother   . Healthy Brother   . Healthy Brother   . Healthy Brother   . Breast cancer Neg Hx   . Colon cancer Neg Hx    Social History   Socioeconomic History  . Marital status: Married    Spouse name: Not on file  . Number of children: 2  . Years of education: Not on file  . Highest education level: Not on file  Occupational History  . Occupation: Chief Financial Officer  Tobacco Use  . Smoking status: Never Smoker  . Smokeless tobacco: Never Used  Vaping Use  . Vaping Use: Never used  Substance and Sexual Activity  . Alcohol use: Yes    Alcohol/week: 4.0 standard drinks     Types: 4 Glasses of wine per week  . Drug use: No  . Sexual activity: Yes    Partners: Male    Birth control/protection: I.U.D.  Other Topics Concern  . Not on file  Social History Narrative  . Not on file   Social Determinants of Health   Financial Resource Strain:   . Difficulty of Paying Living Expenses: Not on file  Food Insecurity:   . Worried About Programme researcher, broadcasting/film/video in the Last Year: Not on file  . Ran Out of Food in the Last Year: Not on file  Transportation Needs:   . Lack of Transportation (Medical): Not on file  . Lack of Transportation (Non-Medical): Not on file  Physical Activity:   . Days of Exercise per Week: Not on file  . Minutes of Exercise per Session: Not on file  Stress:   . Feeling of Stress : Not on file  Social Connections:   . Frequency of Communication with Friends and Family: Not on file  . Frequency of Social Gatherings with Friends and Family: Not on file  . Attends  Religious Services: Not on file  . Active Member of Clubs or Organizations: Not on file  . Attends Banker Meetings: Not on file  . Marital Status: Not on file   Outpatient Medications Prior to Visit  Medication Sig  . cetirizine (ZYRTEC ALLERGY) 10 MG tablet Take 1 tablet (10 mg total) by mouth daily.  . fluticasone (FLONASE) 50 MCG/ACT nasal spray Place 1 spray into both nostrils daily.  Marland Kitchen ibuprofen (ADVIL,MOTRIN) 600 MG tablet Take 1 tablet (600 mg total) by mouth every 6 (six) hours.  . [DISCONTINUED] naproxen (NAPROSYN) 500 MG tablet Take 1 tablet (500 mg total) by mouth 2 (two) times daily.   No facility-administered medications prior to visit.   No Known Allergies  Immunization History  Administered Date(s) Administered  . Influenza, Quadrivalent, Recombinant, Inj, Pf 05/18/2018  . Moderna SARS-COVID-2 Vaccination 01/31/2020, 03/01/2020    Health Maintenance  Topic Date Due  . Hepatitis C Screening  Never done  . TETANUS/TDAP  Never done  .  PAP-Cervical Cytology Screening  Never done  . PAP SMEAR-Modifier  Never done  . INFLUENZA VACCINE  01/06/2020  . COVID-19 Vaccine  Completed  . HIV Screening  Completed    Patient Care Team: Erasmo Downer, MD as PCP - General (Family Medicine)  Review of Systems  Constitutional: Negative.   HENT: Negative.   Eyes: Negative.   Respiratory: Negative.   Cardiovascular: Negative.   Gastrointestinal: Negative.   Endocrine: Negative.   Genitourinary: Negative.   Musculoskeletal: Negative.   Skin: Negative.   Allergic/Immunologic: Negative.   Neurological: Negative.   Hematological: Negative.   Psychiatric/Behavioral: Negative.       Objective    BP 100/68 (BP Location: Left Arm, Patient Position: Sitting, Cuff Size: Normal)   Pulse 60   Temp 98.7 F (37.1 C) (Oral)   Resp 16   Ht 5\' 3"  (1.6 m)   Wt 144 lb 11.2 oz (65.6 kg)   SpO2 99%   Breastfeeding No   BMI 25.63 kg/m  Physical Exam Vitals reviewed.  Constitutional:      General: She is not in acute distress.    Appearance: Normal appearance. She is well-developed. She is not diaphoretic.  HENT:     Head: Normocephalic and atraumatic.     Right Ear: Tympanic membrane, ear canal and external ear normal.     Left Ear: Tympanic membrane, ear canal and external ear normal.  Eyes:     General: No scleral icterus.    Conjunctiva/sclera: Conjunctivae normal.     Pupils: Pupils are equal, round, and reactive to light.  Neck:     Thyroid: No thyromegaly.  Cardiovascular:     Rate and Rhythm: Normal rate and regular rhythm.     Pulses: Normal pulses.     Heart sounds: Normal heart sounds. No murmur heard.   Pulmonary:     Effort: Pulmonary effort is normal. No respiratory distress.     Breath sounds: Normal breath sounds. No wheezing or rales.  Abdominal:     General: There is no distension.     Palpations: Abdomen is soft.     Tenderness: There is no abdominal tenderness.  Musculoskeletal:         General: No deformity.     Cervical back: Neck supple.     Right lower leg: No edema.     Left lower leg: No edema.  Lymphadenopathy:     Cervical: No cervical adenopathy.  Skin:  General: Skin is warm and dry.     Findings: No rash.  Neurological:     Mental Status: She is alert and oriented to person, place, and time. Mental status is at baseline.     Sensory: No sensory deficit.     Motor: No weakness.     Gait: Gait normal.  Psychiatric:        Mood and Affect: Mood normal.        Behavior: Behavior normal.        Thought Content: Thought content normal.     Depression Screen PHQ 2/9 Scores 04/24/2020  PHQ - 2 Score 0  PHQ- 9 Score 0    No results found for any visits on 04/24/20.    Assessment & Plan      Health Maintenance  Topic Date Due  . Hepatitis C Screening  Never done  . TETANUS/TDAP  Never done  . PAP-Cervical Cytology Screening  Never done  . PAP SMEAR-Modifier  Never done  . INFLUENZA VACCINE  01/06/2020  . COVID-19 Vaccine  Completed  . HIV Screening  Completed   Immunization History  Administered Date(s) Administered  . Influenza, Quadrivalent, Recombinant, Inj, Pf 05/18/2018  . Moderna SARS-COVID-2 Vaccination 01/31/2020, 03/01/2020    Problem List Items Addressed This Visit    None    Visit Diagnoses    Encounter for annual physical exam    -  Primary   Relevant Orders   Hepatitis C Antibody   Lipid panel   CBC   Comprehensive metabolic panel   TSH + free T4   Need for influenza vaccination       Relevant Orders   Flu Vaccine QUAD 36+ mos IM   Need for hepatitis C screening test       Relevant Orders   Hepatitis C Antibody   Abnormal TSH       Relevant Orders   TSH + free T4      - discussed wellness and screenings - Discussed importance of healthy weight management - Discussed diet and exercise  - check screening labs - ROI sent to GYN for last pap smear, labs, and immunization records   Return in about 1 year (around  04/24/2021) for CPE.     I, Shirlee Latch, MD, have reviewed all documentation for this visit. The documentation on 04/24/20 for the exam, diagnosis, procedures, and orders are all accurate and complete.   Shila Kruczek, Marzella Schlein, MD, MPH [facility]

## 2020-04-24 ENCOUNTER — Ambulatory Visit (INDEPENDENT_AMBULATORY_CARE_PROVIDER_SITE_OTHER): Payer: BC Managed Care – PPO | Admitting: Family Medicine

## 2020-04-24 ENCOUNTER — Other Ambulatory Visit: Payer: Self-pay

## 2020-04-24 ENCOUNTER — Encounter: Payer: Self-pay | Admitting: Family Medicine

## 2020-04-24 VITALS — BP 100/68 | HR 60 | Temp 98.7°F | Resp 16 | Ht 63.0 in | Wt 144.7 lb

## 2020-04-24 DIAGNOSIS — Z23 Encounter for immunization: Secondary | ICD-10-CM

## 2020-04-24 DIAGNOSIS — R7989 Other specified abnormal findings of blood chemistry: Secondary | ICD-10-CM | POA: Diagnosis not present

## 2020-04-24 DIAGNOSIS — Z1159 Encounter for screening for other viral diseases: Secondary | ICD-10-CM | POA: Diagnosis not present

## 2020-04-24 DIAGNOSIS — Z0000 Encounter for general adult medical examination without abnormal findings: Secondary | ICD-10-CM | POA: Diagnosis not present

## 2020-04-24 NOTE — Patient Instructions (Signed)
Preventive Care 21-27 Years Old, Female Preventive care refers to visits with your health care provider and lifestyle choices that can promote health and wellness. This includes:  A yearly physical exam. This may also be called an annual well check.  Regular dental visits and eye exams.  Immunizations.  Screening for certain conditions.  Healthy lifestyle choices, such as eating a healthy diet, getting regular exercise, not using drugs or products that contain nicotine and tobacco, and limiting alcohol use. What can I expect for my preventive care visit? Physical exam Your health care provider will check your:  Height and weight. This may be used to calculate body mass index (BMI), which tells if you are at a healthy weight.  Heart rate and blood pressure.  Skin for abnormal spots. Counseling Your health care provider may ask you questions about your:  Alcohol, tobacco, and drug use.  Emotional well-being.  Home and relationship well-being.  Sexual activity.  Eating habits.  Work and work environment.  Method of birth control.  Menstrual cycle.  Pregnancy history. What immunizations do I need?  Influenza (flu) vaccine  This is recommended every year. Tetanus, diphtheria, and pertussis (Tdap) vaccine  You may need a Td booster every 10 years. Varicella (chickenpox) vaccine  You may need this if you have not been vaccinated. Human papillomavirus (HPV) vaccine  If recommended by your health care provider, you may need three doses over 6 months. Measles, mumps, and rubella (MMR) vaccine  You may need at least one dose of MMR. You may also need a second dose. Meningococcal conjugate (MenACWY) vaccine  One dose is recommended if you are age 19-21 years and a first-year college student living in a residence hall, or if you have one of several medical conditions. You may also need additional booster doses. Pneumococcal conjugate (PCV13) vaccine  You may need  this if you have certain conditions and were not previously vaccinated. Pneumococcal polysaccharide (PPSV23) vaccine  You may need one or two doses if you smoke cigarettes or if you have certain conditions. Hepatitis A vaccine  You may need this if you have certain conditions or if you travel or work in places where you may be exposed to hepatitis A. Hepatitis B vaccine  You may need this if you have certain conditions or if you travel or work in places where you may be exposed to hepatitis B. Haemophilus influenzae type b (Hib) vaccine  You may need this if you have certain conditions. You may receive vaccines as individual doses or as more than one vaccine together in one shot (combination vaccines). Talk with your health care provider about the risks and benefits of combination vaccines. What tests do I need?  Blood tests  Lipid and cholesterol levels. These may be checked every 5 years starting at age 20.  Hepatitis C test.  Hepatitis B test. Screening  Diabetes screening. This is done by checking your blood sugar (glucose) after you have not eaten for a while (fasting).  Sexually transmitted disease (STD) testing.  BRCA-related cancer screening. This may be done if you have a family history of breast, ovarian, tubal, or peritoneal cancers.  Pelvic exam and Pap test. This may be done every 3 years starting at age 21. Starting at age 30, this may be done every 5 years if you have a Pap test in combination with an HPV test. Talk with your health care provider about your test results, treatment options, and if necessary, the need for more tests.   Follow these instructions at home: Eating and drinking   Eat a diet that includes fresh fruits and vegetables, whole grains, lean protein, and low-fat dairy.  Take vitamin and mineral supplements as recommended by your health care provider.  Do not drink alcohol if: ? Your health care provider tells you not to drink. ? You are  pregnant, may be pregnant, or are planning to become pregnant.  If you drink alcohol: ? Limit how much you have to 0-1 drink a day. ? Be aware of how much alcohol is in your drink. In the [place] one drink equals one 12 oz bottle of beer (355 mL), one 5 oz glass of wine (148 mL), or one 1 oz glass of hard liquor (44 mL). Lifestyle  Take daily care of your teeth and gums.  Stay active. Exercise for at least 30 minutes on 5 or more days each week.  Do not use any products that contain nicotine or tobacco, such as cigarettes, e-cigarettes, and chewing tobacco. If you need help quitting, ask your health care provider.  If you are sexually active, practice safe sex. Use a condom or other form of birth control (contraception) in order to prevent pregnancy and STIs (sexually transmitted infections). If you plan to become pregnant, see your health care provider for a preconception visit. What's next?  Visit your health care provider once a year for a well check visit.  Ask your health care provider how often you should have your eyes and teeth checked.  Stay up to date on all vaccines. This information is not intended to replace advice given to you by your health care provider. Make sure you discuss any questions you have with your health care provider. Document Revised: 02/02/2018 Document Reviewed: 02/02/2018 Elsevier Patient Education  2020 [employer].

## 2020-04-25 ENCOUNTER — Telehealth: Payer: Self-pay

## 2020-04-25 LAB — COMPREHENSIVE METABOLIC PANEL
ALT: 11 IU/L (ref 0–32)
AST: 19 IU/L (ref 0–40)
Albumin/Globulin Ratio: 2.3 — ABNORMAL HIGH (ref 1.2–2.2)
Albumin: 5.1 g/dL — ABNORMAL HIGH (ref 3.9–5.0)
Alkaline Phosphatase: 74 IU/L (ref 44–121)
BUN/Creatinine Ratio: 19 (ref 9–23)
BUN: 15 mg/dL (ref 6–20)
Bilirubin Total: 0.4 mg/dL (ref 0.0–1.2)
CO2: 22 mmol/L (ref 20–29)
Calcium: 9.5 mg/dL (ref 8.7–10.2)
Chloride: 102 mmol/L (ref 96–106)
Creatinine, Ser: 0.78 mg/dL (ref 0.57–1.00)
GFR calc Af Amer: 120 mL/min/{1.73_m2} (ref >59)
GFR calc non Af Amer: 105 mL/min/{1.73_m2} (ref >59)
Globulin, Total: 2.2 g/dL (ref 1.5–4.5)
Glucose: 95 mg/dL (ref 65–99)
Potassium: 4 mmol/L (ref 3.5–5.2)
Sodium: 138 mmol/L (ref 134–144)
Total Protein: 7.3 g/dL (ref 6.0–8.5)

## 2020-04-25 LAB — TSH+FREE T4
Free T4: 1.45 ng/dL (ref 0.82–1.77)
TSH: 0.962 u[IU]/mL (ref 0.450–4.500)

## 2020-04-25 LAB — CBC
Hematocrit: 40.7 % (ref 34.0–46.6)
Hemoglobin: 13.9 g/dL (ref 11.1–15.9)
MCH: 31.3 pg (ref 26.6–33.0)
MCHC: 34.2 g/dL (ref 31.5–35.7)
MCV: 92 fL (ref 79–97)
Platelets: 363 10*3/uL (ref 150–450)
RBC: 4.44 x10E6/uL (ref 3.77–5.28)
RDW: 11.7 % (ref 11.7–15.4)
WBC: 5.4 10*3/uL (ref 3.4–10.8)

## 2020-04-25 LAB — LIPID PANEL
Chol/HDL Ratio: 2.6 ratio (ref 0.0–4.4)
Cholesterol, Total: 180 mg/dL (ref 100–199)
HDL: 68 mg/dL (ref >39)
LDL Chol Calc (NIH): 102 mg/dL — ABNORMAL HIGH (ref 0–99)
Triglycerides: 51 mg/dL (ref 0–149)
VLDL Cholesterol Cal: 10 mg/dL (ref 5–40)

## 2020-04-25 LAB — HEPATITIS C ANTIBODY: Hep C Virus Ab: <0.1 s/co ratio (ref 0.0–0.9)

## 2020-04-25 NOTE — Telephone Encounter (Signed)
Left message advising pt.  (Per DPR)  Thanks,   -Adalida Garver  

## 2020-04-25 NOTE — Telephone Encounter (Signed)
-----   Message from Erasmo Downer, MD sent at 04/25/2020  7:58 AM EST ----- Normal labs

## 2020-08-31 ENCOUNTER — Other Ambulatory Visit: Payer: Self-pay

## 2020-08-31 ENCOUNTER — Ambulatory Visit
Admission: EM | Admit: 2020-08-31 | Discharge: 2020-08-31 | Disposition: A | Discharge status: Home / Self Care | Payer: BC Managed Care – PPO | Attending: Physician Assistant | Admitting: Physician Assistant

## 2020-08-31 DIAGNOSIS — Z23 Encounter for immunization: Secondary | ICD-10-CM

## 2020-08-31 DIAGNOSIS — S91331A Puncture wound without foreign body, right foot, initial encounter: Secondary | ICD-10-CM

## 2020-08-31 MED ORDER — TETANUS-DIPHTH-ACELL PERTUSSIS 5-2.5-18.5 LF-MCG/0.5 IM SUSY
0.5000 mL | PREFILLED_SYRINGE | Freq: Once | INTRAMUSCULAR | Status: AC
  Administered 2020-08-31: 0.5 mL via INTRAMUSCULAR

## 2020-08-31 NOTE — ED Provider Notes (Signed)
[facility]    CSN: [id number] Arrival date & time: 08/31/20  0947      History   Chief Complaint Chief Complaint  Patient presents with  . Foot Injury    HPI Lauren Weaver is a 30 y.o. female.   The history is provided by the patient. No language interpreter was used.  Foot Injury Location:  Foot Injury: no   Foot location:  L foot Pain details:    Quality:  Aching   Radiates to:  Does not radiate   Severity:  Mild Tetanus status:  Out of date Associated symptoms: no fever   Risk factors: no concern for non-accidental trauma   Pt stepped on an earring. Pt reports she is unsure of last tetanus   Past Medical History:  Diagnosis Date  . Allergy   . Medical history non-contributory   . Postpartum care following vaginal delivery (5/29) 11/03/2015    Patient Active Problem List   Diagnosis Date Noted  . Allergic rhinitis 05/02/2018  . Allergic conjunctivitis 05/02/2018  . Active labor 11/03/2015  . Term pregnancy delivered 11/03/2015  . Postpartum care following vaginal delivery (5/29) 11/03/2015    Past Surgical History:  Procedure Laterality Date  . NO PAST SURGERIES      OB History    Gravida  2   Para  2   Term  2   Preterm      AB      Living  1     SAB      IAB      Ectopic      Multiple  0   Live Births  1            Home Medications    Prior to Admission medications   Medication Sig Start Date End Date Taking? Authorizing Provider  cetirizine (ZYRTEC ALLERGY) 10 MG tablet Take 1 tablet (10 mg total) by mouth daily. 04/17/20   Hall-Potvin, [place], PA-C  fluticasone (FLONASE) 50 MCG/ACT nasal spray Place 1 spray into both nostrils daily. 04/17/20   Hall-Potvin, [place], PA-C  ibuprofen (ADVIL,MOTRIN) 600 MG tablet Take 1 tablet (600 mg total) by mouth every 6 (six) hours. 11/04/15   Marlinda Mike., CNM  dicyclomine (BENTYL) 20 MG tablet Take 1 tablet (20 mg total) by mouth 2 (two) times daily. 11/28/18  04/17/20  Belinda Fisher, PA-C    Family History Family History  Problem Relation Age of Onset  . Healthy Mother   . Healthy Father   . Healthy Sister   . Healthy Brother   . Healthy Daughter   . Healthy Son   . Healthy Sister   . Healthy Sister   . Healthy Brother   . Healthy Brother   . Healthy Brother   . Healthy Brother   . Breast cancer Neg Hx   . Colon cancer Neg Hx     Social History Social History   Tobacco Use  . Smoking status: Never Smoker  . Smokeless tobacco: Never Used  Vaping Use  . Vaping Use: Never used  Substance Use Topics  . Alcohol use: Yes    Alcohol/week: 4.0 standard drinks    Types: 4 Glasses of wine per week  . Drug use: No     Allergies   Patient has no known allergies.   Review of Systems Review of Systems  Constitutional: Negative for fever.  All other systems reviewed and are negative.    Physical Exam Triage Vital Signs ED  Triage Vitals  Enc Vitals Group     BP 08/31/20 1011 116/75     Pulse Rate 08/31/20 1011 61     Resp 08/31/20 1011 20     Temp 08/31/20 1011 98.1 F (36.7 C)     Temp Source 08/31/20 1011 Oral     SpO2 08/31/20 1011 96 %     Weight --      Height --      Head Circumference --      Peak Flow --      Pain Score 08/31/20 1014 2     Pain Loc --      Pain Edu? --      Excl. in GC? --    No data found.  Updated Vital Signs BP 116/75 (BP Location: Left Arm)   Pulse 61   Temp 98.1 F (36.7 C) (Oral)   Resp 20   LMP  (LMP Unknown)   SpO2 96%   Visual Acuity Right Eye Distance:   Left Eye Distance:   Bilateral Distance:    Right Eye Near:   Left Eye Near:    Bilateral Near:     Physical Exam Vitals reviewed.  Constitutional:      Appearance: Normal appearance.  Skin:    General: Skin is warm.     Comments: Puncture wound left foot,  No sign of infection no swelling  Neurological:     General: No focal deficit present.     Mental Status: She is alert.  Psychiatric:        Mood and  Affect: Mood normal.      UC Treatments / Results  Labs (all labs ordered are listed, but only abnormal results are displayed) Labs Reviewed - No data to display  EKG   Radiology No results found.  Procedures Procedures (including critical care time)  Medications Ordered in UC Medications  Tdap (BOOSTRIX) injection 0.5 mL (0.5 mLs Intramuscular Given 08/31/20 1025)    Initial Impression / Assessment and Plan / UC Course  I have reviewed the triage vital signs and the nursing notes.  Pertinent labs & imaging results that were available during my care of the patient were reviewed by me and considered in my medical decision making (see chart for details).     MDM:  Pt given tetanus,  I advised watch for infection  Final Clinical Impressions(s) / UC Diagnoses   Final diagnoses:  Puncture wound of right foot, initial encounter     Discharge Instructions     Return if any problems.    ED Prescriptions    None     PDMP not reviewed this encounter.  An After Visit Summary was printed and given to the patient.    Elson Areas, [place] 08/31/20 1031

## 2020-08-31 NOTE — Discharge Instructions (Addendum)
Return if any problems.

## 2020-08-31 NOTE — ED Triage Notes (Signed)
Pt presents with left foot tenderness after stepping on an ea ring on wednesday

## 2020-10-31 ENCOUNTER — Ambulatory Visit: Payer: Self-pay | Admitting: *Deleted

## 2020-10-31 NOTE — Telephone Encounter (Signed)
In addition to RN recommendations, can take tylenol prn for headache. This is very common with COVID and often the worst symptoms.

## 2020-10-31 NOTE — Telephone Encounter (Signed)
Patient returned call- advised per PCP note- she will monitor symptoms and call back if she gets worse.

## 2020-10-31 NOTE — Telephone Encounter (Signed)
Pt stated she tested positive for Covid last Wednesday and she start to feel better but she continues to have a headache. Pt would like to know if there is a medication she could be prescribed or what she should be doing to help relieve the headache. Pt requests call back. Cb# [phone]   Call to patient: Patient was diagnosed with COVID 5/18- patient states she started headache Sunday 5/22. Patient states she is using hydration, rest and motrin- not going away.Patient reports her headache is forehead/top of head area- constant and and moderate pain level.Some intermittent nausea at times. Patient reports she does have congestion- but is not treating that. Advised patient - continue hydration, rest, can try decongestant to relieve pressure in head, massage of area, heat/cold pack for comfort. Will send message to provider to see if she has any other treatment options.  Reason for Disposition . [1] MILD-MODERATE headache AND [2] present > 72 hours  Answer Assessment - Initial Assessment Questions 1. LOCATION: "Where does it hurt?"      Forehead and top of head- pressure and tightness 2. ONSET: "When did the headache start?" (Minutes, hours or days)      5/22 3. PATTERN: "Does the pain come and go, or has it been constant since it started?"     constant 4. SEVERITY: "How bad is the pain?" and "What does it keep you from doing?"  (e.g., Scale 1-10; mild, moderate, or severe)   - MILD (1-3): doesn't interfere with normal activities    - MODERATE (4-7): interferes with normal activities or awakens from sleep    - SEVERE (8-10): excruciating pain, unable to do any normal activities        moderate 5. RECURRENT SYMPTOM: "Have you ever had headaches before?" If Yes, ask: "When was the last time?" and "What happened that time?"      no 6. CAUSE: "What do you think is causing the headache?"     COVID virus 7. MIGRAINE: "Have you been diagnosed with migraine headaches?" If Yes, ask: "Is this headache  similar?"      no 8. HEAD INJURY: "Has there been any recent injury to the head?"      no 9. OTHER SYMPTOMS: "Do you have any other symptoms?" (fever, stiff neck, eye pain, sore throat, cold symptoms)     Congestion/sinus congestion 10. PREGNANCY: "Is there any chance you are pregnant?" "When was your last menstrual period?"       No- LMP- normal cycsles  Protocols used: HEADACHE-A-AH

## 2020-10-31 NOTE — Telephone Encounter (Signed)
Tried returning call to pt. No answer and no vm. Okay for PEC to give patient message below.

## 2021-01-24 ENCOUNTER — Ambulatory Visit
Admission: EM | Admit: 2021-01-24 | Discharge: 2021-01-24 | Disposition: A | Discharge status: Home / Self Care | Payer: BC Managed Care – PPO

## 2021-01-24 ENCOUNTER — Encounter: Payer: Self-pay | Admitting: *Deleted

## 2021-01-24 ENCOUNTER — Other Ambulatory Visit: Payer: Self-pay

## 2021-01-24 DIAGNOSIS — J3089 Other allergic rhinitis: Secondary | ICD-10-CM

## 2021-01-24 DIAGNOSIS — B9789 Other viral agents as the cause of diseases classified elsewhere: Secondary | ICD-10-CM

## 2021-01-24 DIAGNOSIS — R0981 Nasal congestion: Secondary | ICD-10-CM

## 2021-01-24 DIAGNOSIS — J988 Other specified respiratory disorders: Secondary | ICD-10-CM

## 2021-01-24 DIAGNOSIS — R059 Cough, unspecified: Secondary | ICD-10-CM

## 2021-01-24 MED ORDER — PREDNISONE 20 MG PO TABS: ORAL_TABLET | ORAL | 0 refills | Status: DC

## 2021-01-24 MED ORDER — BENZONATATE 100 MG PO CAPS: 100.0000 mg | ORAL_CAPSULE | Freq: Three times a day (TID) | ORAL | 0 refills | Status: DC | PRN

## 2021-01-24 MED ORDER — PROMETHAZINE-DM 6.25-15 MG/5ML PO SYRP: 5.0000 mL | ORAL_SOLUTION | Freq: Every evening | ORAL | 0 refills | Status: DC | PRN

## 2021-01-24 MED ORDER — AMOXICILLIN-POT CLAVULANATE 875-125 MG PO TABS: 1.0000 | ORAL_TABLET | Freq: Two times a day (BID) | ORAL | 0 refills | Status: DC

## 2021-01-24 NOTE — ED Triage Notes (Signed)
C/O productive cough and nasal congestion x 4 wks with intermittent HAs.  Has been taking Mucinex and Dayquil.

## 2021-01-24 NOTE — ED Provider Notes (Signed)
[facility]   MRN: [id number] DOB: Oct 06, 1990  Subjective:   Lauren Weaver is a 30 y.o. female presenting for 4-week history of persistent sinus congestion, sinus pressure, productive cough.  Has had intermittent sinus headaches.  Has been using Mucinex and DayQuil daily.  He has a history of persistent allergic rhinitis.  Uses Flonase and Zyrtec consistently.  Denies fever, chest pain, shortness of breath, sinus pain, ear pain.  Patient is not a smoker.  No current facility-administered medications for this encounter.  Current Outpatient Medications:    cetirizine (ZYRTEC ALLERGY) 10 MG tablet, Take 1 tablet (10 mg total) by mouth daily., Disp: 30 tablet, Rfl: 0   fluticasone (FLONASE) 50 MCG/ACT nasal spray, Place 1 spray into both nostrils daily., Disp: 16 g, Rfl: 0   guaiFENesin (MUCINEX PO), Take by mouth., Disp: , Rfl:    Pseudoephedrine-APAP-DM (DAYQUIL PO), Take by mouth., Disp: , Rfl:    ibuprofen (ADVIL,MOTRIN) 600 MG tablet, Take 1 tablet (600 mg total) by mouth every 6 (six) hours., Disp: 30 tablet, Rfl: 0   No Known Allergies  Past Medical History:  Diagnosis Date   Allergy    Postpartum care following vaginal delivery (5/29) 11/03/2015     Past Surgical History:  Procedure Laterality Date   NO PAST SURGERIES      Family History  Problem Relation Age of Onset   Healthy Mother    Healthy Father    Healthy Sister    Healthy Brother    Healthy Daughter    Healthy Son    Healthy Sister    Healthy Sister    Healthy Brother    Healthy Brother    Healthy Brother    Healthy Brother    Breast cancer Neg Hx    Colon cancer Neg Hx     Social History   Tobacco Use   Smoking status: Never   Smokeless tobacco: Never  Vaping Use   Vaping Use: Never used  Substance Use Topics   Alcohol use: Yes    Alcohol/week: 4.0 standard drinks    Types: 4 Glasses of wine per week   Drug use: No    ROS   Objective:   Vitals: BP 118/73 (BP  Location: Left Arm)   Pulse 69   Temp 98.1 F (36.7 C) (Oral)   Resp 20   SpO2 98%   Physical Exam Constitutional:      General: She is not in acute distress.    Appearance: Normal appearance. She is well-developed. She is not ill-appearing, toxic-appearing or diaphoretic.  HENT:     Head: Normocephalic and atraumatic.     Right Ear: Tympanic membrane, ear canal and external ear normal. No drainage or tenderness. No middle ear effusion. Tympanic membrane is not erythematous.     Left Ear: Tympanic membrane, ear canal and external ear normal. No drainage or tenderness.  No middle ear effusion. Tympanic membrane is not erythematous.     Nose: Congestion and rhinorrhea present.     Mouth/Throat:     Mouth: Mucous membranes are moist. No oral lesions.     Pharynx: No pharyngeal swelling, oropharyngeal exudate, posterior oropharyngeal erythema or uvula swelling.     Tonsils: No tonsillar exudate or tonsillar abscesses.  Eyes:     General: No scleral icterus.       Right eye: No discharge.        Left eye: No discharge.     Extraocular Movements: Extraocular movements intact.  Right eye: Normal extraocular motion.     Left eye: Normal extraocular motion.     Conjunctiva/sclera: Conjunctivae normal.     Pupils: Pupils are equal, round, and reactive to light.  Cardiovascular:     Rate and Rhythm: Normal rate and regular rhythm.     Pulses: Normal pulses.     Heart sounds: Normal heart sounds. No murmur heard.   No friction rub. No gallop.  Pulmonary:     Effort: Pulmonary effort is normal. No respiratory distress.     Breath sounds: Normal breath sounds. No stridor. No wheezing, rhonchi or rales.  Musculoskeletal:     Cervical back: Normal range of motion and neck supple.  Lymphadenopathy:     Cervical: No cervical adenopathy.  Skin:    General: Skin is warm and dry.     Findings: No rash.  Neurological:     General: No focal deficit present.     Mental Status: She is alert  and oriented to person, place, and time.     Cranial Nerves: No cranial nerve deficit.     Motor: No weakness.     Coordination: Coordination normal.     Gait: Gait normal.     Deep Tendon Reflexes: Reflexes normal.  Psychiatric:        Mood and Affect: Mood normal.        Behavior: Behavior normal.        Thought Content: Thought content normal.        Judgment: Judgment normal.      Assessment and Plan :   PDMP not reviewed this encounter.  1. Viral respiratory illness   2. Allergic rhinitis due to other allergic trigger, unspecified seasonality   3. Nasal congestion   4. Cough     Suspect patient may have started out with a viral illness and has persisted in her symptoms due to her allergic rhinitis.  Offered her prednisone course for this.  She does not have sinus pain and therefore did not believe that she needed antibiotics at this stage but I did offer her a printed prescription of Augmentin should she have no improvement with the prednisone over the next 2 days. Counseled patient on potential for adverse effects with medications prescribed/recommended today, ER and return-to-clinic precautions discussed, patient verbalized understanding.    Wallis Bamberg, [place] 01/24/21 [phone]

## 2021-04-05 ENCOUNTER — Other Ambulatory Visit: Payer: Self-pay

## 2021-04-05 ENCOUNTER — Encounter: Payer: Self-pay | Admitting: *Deleted

## 2021-04-05 ENCOUNTER — Ambulatory Visit
Admission: EM | Admit: 2021-04-05 | Discharge: 2021-04-05 | Disposition: A | Discharge status: Home / Self Care | Payer: BC Managed Care – PPO | Attending: Physician Assistant | Admitting: Physician Assistant

## 2021-04-05 DIAGNOSIS — H1033 Unspecified acute conjunctivitis, bilateral: Secondary | ICD-10-CM | POA: Diagnosis not present

## 2021-04-05 MED ORDER — BACITRACIN-POLYMYXIN B 500-10000 UNIT/GM OP OINT: 1.0000 "application " | TOPICAL_OINTMENT | Freq: Two times a day (BID) | OPHTHALMIC | 0 refills | Status: AC

## 2021-04-05 NOTE — ED Triage Notes (Signed)
Reports having crustin in bilat eyes this AM; started with bilat eye redness, irritation, and drainage.  Reports members of household currently have pink eye.  Denies changes in vision, but reports slight photosensitivity.  Does not wear contact lenses.

## 2021-04-05 NOTE — ED Provider Notes (Signed)
[facility]    CSN: [id number] Arrival date & time: 04/05/21  1016      History   Chief Complaint Chief Complaint  Patient presents with   Eye Problem    HPI Lauren Weaver is a 30 y.o. female.   Patient in today for evaluation of bilateral pinkeye that started this morning.  She states that she did have some crusting to her lashes and she had to use wipe to remove.  She states multiple members of her household have also had pinkeye recently.  She does not wear contact lenses.  She reports for the last 5 days or so she has had some upper respiratory symptoms including nasal congestion and cough.  These do not seem to be worsening.  The history is provided by the patient.  Eye Problem Associated symptoms: discharge and redness   Associated symptoms: no nausea and no vomiting    Past Medical History:  Diagnosis Date   Allergy    Postpartum care following vaginal delivery (5/29) 11/03/2015    Patient Active Problem List   Diagnosis Date Noted   Allergic rhinitis 05/02/2018   Allergic conjunctivitis 05/02/2018   Active labor 11/03/2015   Term pregnancy delivered 11/03/2015   Postpartum care following vaginal delivery (5/29) 11/03/2015    Past Surgical History:  Procedure Laterality Date   NO PAST SURGERIES      OB History     Gravida  2   Para  2   Term  2   Preterm      AB      Living  1      SAB      IAB      Ectopic      Multiple  0   Live Births  1            Home Medications    Prior to Admission medications   Medication Sig Start Date End Date Taking? Authorizing Provider  bacitracin-polymyxin b (POLYSPORIN) ophthalmic ointment Place 1 application into both eyes every 12 (twelve) hours for 7 days. apply to eye every 12 hours while awake 04/05/21 04/12/21 Yes Tomi Bamberger, PA-C  amoxicillin-clavulanate (AUGMENTIN) 875-125 MG tablet Take 1 tablet by mouth every 12 (twelve) hours. 01/24/21   Wallis Bamberg, PA-C   benzonatate (TESSALON) 100 MG capsule Take 1-2 capsules (100-200 mg total) by mouth 3 (three) times daily as needed for cough. 01/24/21   Wallis Bamberg, PA-C  cetirizine (ZYRTEC ALLERGY) 10 MG tablet Take 1 tablet (10 mg total) by mouth daily. 04/17/20   Hall-Potvin, [place], PA-C  fluticasone (FLONASE) 50 MCG/ACT nasal spray Place 1 spray into both nostrils daily. 04/17/20   Hall-Potvin, [place], PA-C  guaiFENesin (MUCINEX PO) Take by mouth.    [provider]  ibuprofen (ADVIL,MOTRIN) 600 MG tablet Take 1 tablet (600 mg total) by mouth every 6 (six) hours. 11/04/15   Marlinda Mike, CNM  predniSONE (DELTASONE) 20 MG tablet Take 2 tablets daily with breakfast. 01/24/21   Wallis Bamberg, PA-C  promethazine-dextromethorphan (PROMETHAZINE-DM) 6.25-15 MG/5ML syrup Take 5 mLs by mouth at bedtime as needed for cough. 01/24/21   Wallis Bamberg, PA-C  Pseudoephedrine-APAP-DM (DAYQUIL PO) Take by mouth.    [provider]  dicyclomine (BENTYL) 20 MG tablet Take 1 tablet (20 mg total) by mouth 2 (two) times daily. 11/28/18 04/17/20  Belinda Fisher, PA-C    Family History Family History  Problem Relation Age of Onset   Healthy Mother  Healthy Father    Healthy Sister    Healthy Brother    Healthy Daughter    Healthy Son    Healthy Sister    Healthy Sister    Healthy Brother    Healthy Brother    Healthy Brother    Healthy Brother    Breast cancer Neg Hx    Colon cancer Neg Hx     Social History Social History   Tobacco Use   Smoking status: Never   Smokeless tobacco: Never  Vaping Use   Vaping Use: Never used  Substance Use Topics   Alcohol use: Yes    Alcohol/week: 4.0 standard drinks    Types: 4 Glasses of wine per week    Comment: occasionally   Drug use: No     Allergies   Patient has no known allergies.   Review of Systems Review of Systems  Constitutional:  Negative for chills and fever.  HENT:  Positive for congestion. Negative for ear pain.   Eyes:  Positive  for discharge and redness.  Respiratory:  Positive for cough. Negative for shortness of breath and wheezing.   Gastrointestinal:  Negative for abdominal pain, diarrhea, nausea and vomiting.  Genitourinary:  Positive for vaginal bleeding and vaginal discharge.    Physical Exam Triage Vital Signs ED Triage Vitals  Enc Vitals Group     BP 04/05/21 1032 114/75     Pulse Rate 04/05/21 1032 (!) 58     Resp 04/05/21 1032 16     Temp 04/05/21 1032 98.7 F (37.1 C)     Temp Source 04/05/21 1032 Temporal     SpO2 04/05/21 1032 96 %     Weight --      Height --      Head Circumference --      Peak Flow --      Pain Score 04/05/21 1033 6     Pain Loc --      Pain Edu? --      Excl. in GC? --    No data found.  Updated Vital Signs BP 114/75   Pulse (!) 58   Temp 98.7 F (37.1 C) (Temporal)   Resp 16   SpO2 96%   Visual Acuity Right Eye Distance: 20/25 Left Eye Distance: 20/20 -1 Bilateral Distance: 20/20     Physical Exam Vitals and nursing note reviewed.  Constitutional:      General: She is not in acute distress.    Appearance: Normal appearance. She is not ill-appearing.  HENT:     Head: Normocephalic and atraumatic.  Eyes:     Extraocular Movements: Extraocular movements intact.     Pupils: Pupils are equal, round, and reactive to light.     Comments: Bilateral conjunctiva mildly injected  Cardiovascular:     Rate and Rhythm: Normal rate.  Pulmonary:     Effort: Pulmonary effort is normal.  Neurological:     Mental Status: She is alert.  Psychiatric:        Mood and Affect: Mood normal.        Behavior: Behavior normal.        Thought Content: Thought content normal.     UC Treatments / Results  Labs (all labs ordered are listed, but only abnormal results are displayed) Labs Reviewed - No data to display  EKG   Radiology No results found.  Procedures Procedures (including critical care time)  Medications Ordered in UC Medications - No data to  display  Initial Impression / Assessment and Plan / UC Course  I have reviewed the triage vital signs and the nursing notes.  Pertinent labs & imaging results that were available during my care of the patient were reviewed by me and considered in my medical decision making (see chart for details).   Suspect viral etiology of upper respiratory symptoms. Will treat with antibiotic ointment. Encouraged follow up with any further concerns.   Final Clinical Impressions(s) / UC Diagnoses   Final diagnoses:  Acute conjunctivitis of both eyes, unspecified acute conjunctivitis type   Discharge Instructions   None    ED Prescriptions     Medication Sig Dispense Auth. Provider   bacitracin-polymyxin b (POLYSPORIN) ophthalmic ointment Place 1 application into both eyes every 12 (twelve) hours for 7 days. apply to eye every 12 hours while awake 3.5 g Tomi Bamberger, PA-C      PDMP not reviewed this encounter.   Tomi Bamberger, PA-C 04/05/21 1212

## 2021-10-20 ENCOUNTER — Ambulatory Visit: Payer: Self-pay

## 2021-10-20 NOTE — Telephone Encounter (Signed)
?  Chief Complaint: jaw pain, clicking and locking ?Symptoms: chronic jaw pain for a year worsening. Jaw clicking and locked this am cannot open mouth wide ?Frequency: jaw joint locked this morning for 5 minutes,  ?Pertinent Negatives: Patient denies fever, toothache,nasal discharge,nasal congestion ?Disposition: [] ED /[] Urgent Care (no appt availability in office) / [x] Appointment(In office/virtual)/ []  [place] [facility]/ [] Home Care/ [] Refused Recommended Disposition /[] [place] [facility]/ []  Follow-up with PCP ?Additional Notes: advised to call back if worsens ? ? ? ? ? ? ? ?Reason for Disposition ? Face pain  is a chronic symptom (recurrent or ongoing AND present > 4 weeks) ? ?Answer Assessment - Initial Assessment Questions ?1. ONSET: "When did the pain start?" (e.g., minutes, hours, days) ?    Chronic jaw pain that has worsened over the past year ? ?. SEVERITY: "How bad is the pain?"   (Scale 1-10; mild, moderate or severe) ?  - MILD (1-3): doesn't interfere with normal activities  ?  - MODERATE (4-7): interferes with normal activities or awakens from sleep  ?  - SEVERE (8-10): excruciating pain, unable to do any normal activities  ?    mild ?3. LOCATION: "Where does it hurt?"  ?    Jaw joint ?4 RASH: "Is there any redness, rash, or swelling of the face?" ?    no ?5. FEVER: "Do you have a fever?" If Yes, ask: "What is it, how was it measured, and when did it start?"  ?    no ?6 OTHER SYMPTOMS: "Do you have any other symptoms?" (e.g., fever, toothache, nasal discharge, nasal congestion, clicking sensation in jaw joint) ?    Clicking sensation in jaw, jaw locks once a day ?7 PREGNANCY: "Is there any chance you are pregnant?" "When was your last menstrual period?" ?    N/a ? ?Protocols used: Face Pain-A-AH ? ?

## 2021-10-28 NOTE — Progress Notes (Unsigned)
     I,Salih Williamson S Dajai Wahlert,acting as a Neurosurgeon for Shirlee Latch, MD.,have documented all relevant documentation on the behalf of Shirlee Latch, MD,as directed by  Shirlee Latch, MD while in the presence of Shirlee Latch, MD.   Established patient visit   Patient: Lauren Weaver   DOB: Sep 07, 1990   31 y.o. Female  MRN: [id number] Visit Date: 10/29/2021  Today's healthcare provider: Shirlee Latch, MD   No chief complaint on file.  Subjective    HPI  Patient here today C/O jaw pain/locking.   Medications: Outpatient Medications Prior to Visit  Medication Sig   amoxicillin-clavulanate (AUGMENTIN) 875-125 MG tablet Take 1 tablet by mouth every 12 (twelve) hours.   benzonatate (TESSALON) 100 MG capsule Take 1-2 capsules (100-200 mg total) by mouth 3 (three) times daily as needed for cough.   cetirizine (ZYRTEC ALLERGY) 10 MG tablet Take 1 tablet (10 mg total) by mouth daily.   fluticasone (FLONASE) 50 MCG/ACT nasal spray Place 1 spray into both nostrils daily.   guaiFENesin (MUCINEX PO) Take by mouth.   ibuprofen (ADVIL,MOTRIN) 600 MG tablet Take 1 tablet (600 mg total) by mouth every 6 (six) hours.   predniSONE (DELTASONE) 20 MG tablet Take 2 tablets daily with breakfast.   promethazine-dextromethorphan (PROMETHAZINE-DM) 6.25-15 MG/5ML syrup Take 5 mLs by mouth at bedtime as needed for cough.   Pseudoephedrine-APAP-DM (DAYQUIL PO) Take by mouth.   No facility-administered medications prior to visit.    Review of Systems  Last CBC Lab Results  Component Value Date   WBC 5.4 04/24/2020   HGB 13.9 04/24/2020   HCT 40.7 04/24/2020   MCV 92 04/24/2020   MCH 31.3 04/24/2020   RDW 11.7 04/24/2020   PLT 363 04/24/2020   Last metabolic panel Lab Results  Component Value Date   GLUCOSE 95 04/24/2020   NA 138 04/24/2020   K 4.0 04/24/2020   CL 102 04/24/2020   CO2 22 04/24/2020   BUN 15 04/24/2020   CREATININE 0.78 04/24/2020   GFRNONAA 105 04/24/2020    CALCIUM 9.5 04/24/2020   PROT 7.3 04/24/2020   ALBUMIN 5.1 (H) 04/24/2020   LABGLOB 2.2 04/24/2020   AGRATIO 2.3 (H) 04/24/2020   BILITOT 0.4 04/24/2020   ALKPHOS 74 04/24/2020   AST 19 04/24/2020   ALT 11 04/24/2020   Last lipids Lab Results  Component Value Date   CHOL 180 04/24/2020   HDL 68 04/24/2020   LDLCALC 102 (H) 04/24/2020   TRIG 51 04/24/2020   CHOLHDL 2.6 04/24/2020       Objective    There were no vitals taken for this visit. BP Readings from Last 3 Encounters:  04/05/21 114/75  01/24/21 118/73  08/31/20 116/75   Wt Readings from Last 3 Encounters:  04/24/20 144 lb 11.2 oz (65.6 kg)  04/17/20 130 lb (59 kg)  05/02/18 132 lb (59.9 kg)      Physical Exam  ***  No results found for any visits on 10/29/21.  Assessment & Plan     ***  No follow-ups on file.      {provider attestation***:1}   Shirlee Latch, MD  [facility] [phone] (phone) [phone] (fax)  [facility]

## 2021-10-29 ENCOUNTER — Ambulatory Visit: Payer: BC Managed Care – PPO | Admitting: Family Medicine

## 2021-10-29 ENCOUNTER — Encounter: Payer: Self-pay | Admitting: Family Medicine

## 2021-10-29 VITALS — BP 125/81 | HR 64 | Temp 99.0°F | Resp 16 | Wt 153.0 lb

## 2021-10-29 DIAGNOSIS — M26609 Unspecified temporomandibular joint disorder, unspecified side: Secondary | ICD-10-CM

## 2021-10-29 MED ORDER — MELOXICAM 15 MG PO TABS: 15.0000 mg | ORAL_TABLET | Freq: Every day | ORAL | 0 refills | Status: DC

## 2021-11-19 DIAGNOSIS — F4323 Adjustment disorder with mixed anxiety and depressed mood: Secondary | ICD-10-CM | POA: Diagnosis not present

## 2021-11-26 DIAGNOSIS — F4323 Adjustment disorder with mixed anxiety and depressed mood: Secondary | ICD-10-CM | POA: Diagnosis not present

## 2021-12-10 DIAGNOSIS — F4323 Adjustment disorder with mixed anxiety and depressed mood: Secondary | ICD-10-CM | POA: Diagnosis not present

## 2021-12-25 DIAGNOSIS — F4323 Adjustment disorder with mixed anxiety and depressed mood: Secondary | ICD-10-CM | POA: Diagnosis not present

## 2022-01-14 DIAGNOSIS — F4323 Adjustment disorder with mixed anxiety and depressed mood: Secondary | ICD-10-CM | POA: Diagnosis not present

## 2022-02-01 DIAGNOSIS — F4323 Adjustment disorder with mixed anxiety and depressed mood: Secondary | ICD-10-CM | POA: Diagnosis not present

## 2022-02-15 DIAGNOSIS — F4323 Adjustment disorder with mixed anxiety and depressed mood: Secondary | ICD-10-CM | POA: Diagnosis not present

## 2022-06-03 DIAGNOSIS — F800 Phonological disorder: Secondary | ICD-10-CM | POA: Diagnosis not present

## 2022-06-03 DIAGNOSIS — M2659 Other dentofacial functional abnormalities: Secondary | ICD-10-CM | POA: Diagnosis not present

## 2022-06-25 DIAGNOSIS — M2659 Other dentofacial functional abnormalities: Secondary | ICD-10-CM | POA: Diagnosis not present

## 2022-06-25 DIAGNOSIS — F800 Phonological disorder: Secondary | ICD-10-CM | POA: Diagnosis not present

## 2022-06-28 DIAGNOSIS — F800 Phonological disorder: Secondary | ICD-10-CM | POA: Diagnosis not present

## 2022-06-28 DIAGNOSIS — M2659 Other dentofacial functional abnormalities: Secondary | ICD-10-CM | POA: Diagnosis not present

## 2022-07-08 DIAGNOSIS — D224 Melanocytic nevi of scalp and neck: Secondary | ICD-10-CM | POA: Diagnosis not present

## 2022-07-08 DIAGNOSIS — D225 Melanocytic nevi of trunk: Secondary | ICD-10-CM | POA: Diagnosis not present

## 2022-07-09 DIAGNOSIS — F800 Phonological disorder: Secondary | ICD-10-CM | POA: Diagnosis not present

## 2022-07-09 DIAGNOSIS — M2659 Other dentofacial functional abnormalities: Secondary | ICD-10-CM | POA: Diagnosis not present

## 2022-07-12 DIAGNOSIS — F800 Phonological disorder: Secondary | ICD-10-CM | POA: Diagnosis not present

## 2022-07-12 DIAGNOSIS — M2659 Other dentofacial functional abnormalities: Secondary | ICD-10-CM | POA: Diagnosis not present

## 2022-07-19 DIAGNOSIS — M2659 Other dentofacial functional abnormalities: Secondary | ICD-10-CM | POA: Diagnosis not present

## 2022-07-19 DIAGNOSIS — F800 Phonological disorder: Secondary | ICD-10-CM | POA: Diagnosis not present

## 2022-07-26 DIAGNOSIS — M2659 Other dentofacial functional abnormalities: Secondary | ICD-10-CM | POA: Diagnosis not present

## 2022-07-26 DIAGNOSIS — F800 Phonological disorder: Secondary | ICD-10-CM | POA: Diagnosis not present

## 2022-08-02 DIAGNOSIS — F800 Phonological disorder: Secondary | ICD-10-CM | POA: Diagnosis not present

## 2022-08-02 DIAGNOSIS — M2659 Other dentofacial functional abnormalities: Secondary | ICD-10-CM | POA: Diagnosis not present

## 2022-08-09 DIAGNOSIS — F800 Phonological disorder: Secondary | ICD-10-CM | POA: Diagnosis not present

## 2022-08-09 DIAGNOSIS — M2659 Other dentofacial functional abnormalities: Secondary | ICD-10-CM | POA: Diagnosis not present

## 2022-08-16 DIAGNOSIS — M2659 Other dentofacial functional abnormalities: Secondary | ICD-10-CM | POA: Diagnosis not present

## 2022-08-16 DIAGNOSIS — F800 Phonological disorder: Secondary | ICD-10-CM | POA: Diagnosis not present

## 2022-08-23 DIAGNOSIS — F800 Phonological disorder: Secondary | ICD-10-CM | POA: Diagnosis not present

## 2022-08-23 DIAGNOSIS — M2659 Other dentofacial functional abnormalities: Secondary | ICD-10-CM | POA: Diagnosis not present

## 2022-08-30 DIAGNOSIS — F800 Phonological disorder: Secondary | ICD-10-CM | POA: Diagnosis not present

## 2022-08-30 DIAGNOSIS — M2659 Other dentofacial functional abnormalities: Secondary | ICD-10-CM | POA: Diagnosis not present

## 2022-09-06 DIAGNOSIS — M2659 Other dentofacial functional abnormalities: Secondary | ICD-10-CM | POA: Diagnosis not present

## 2022-09-06 DIAGNOSIS — F800 Phonological disorder: Secondary | ICD-10-CM | POA: Diagnosis not present

## 2022-09-17 DIAGNOSIS — M2659 Other dentofacial functional abnormalities: Secondary | ICD-10-CM | POA: Diagnosis not present

## 2022-09-17 DIAGNOSIS — F800 Phonological disorder: Secondary | ICD-10-CM | POA: Diagnosis not present

## 2022-09-20 DIAGNOSIS — M2659 Other dentofacial functional abnormalities: Secondary | ICD-10-CM | POA: Diagnosis not present

## 2022-09-20 DIAGNOSIS — F800 Phonological disorder: Secondary | ICD-10-CM | POA: Diagnosis not present

## 2022-09-22 DIAGNOSIS — F411 Generalized anxiety disorder: Secondary | ICD-10-CM | POA: Diagnosis not present

## 2022-09-27 DIAGNOSIS — M2659 Other dentofacial functional abnormalities: Secondary | ICD-10-CM | POA: Diagnosis not present

## 2022-09-27 DIAGNOSIS — F800 Phonological disorder: Secondary | ICD-10-CM | POA: Diagnosis not present

## 2022-09-28 DIAGNOSIS — F411 Generalized anxiety disorder: Secondary | ICD-10-CM | POA: Diagnosis not present

## 2022-10-11 DIAGNOSIS — M2659 Other dentofacial functional abnormalities: Secondary | ICD-10-CM | POA: Diagnosis not present

## 2022-10-11 DIAGNOSIS — F800 Phonological disorder: Secondary | ICD-10-CM | POA: Diagnosis not present

## 2022-10-14 DIAGNOSIS — F411 Generalized anxiety disorder: Secondary | ICD-10-CM | POA: Diagnosis not present

## 2022-10-18 DIAGNOSIS — M2659 Other dentofacial functional abnormalities: Secondary | ICD-10-CM | POA: Diagnosis not present

## 2022-10-18 DIAGNOSIS — F800 Phonological disorder: Secondary | ICD-10-CM | POA: Diagnosis not present

## 2022-10-26 ENCOUNTER — Ambulatory Visit: Payer: Self-pay

## 2022-10-26 ENCOUNTER — Ambulatory Visit
Admission: RE | Admit: 2022-10-26 | Discharge: 2022-10-26 | Disposition: A | Discharge status: Home / Self Care | Payer: BC Managed Care – PPO | Source: Ambulatory Visit | Attending: Physician Assistant | Admitting: Physician Assistant

## 2022-10-26 ENCOUNTER — Ambulatory Visit
Admission: RE | Admit: 2022-10-26 | Discharge: 2022-10-26 | Disposition: A | Discharge status: Home / Self Care | Payer: BC Managed Care – PPO | Attending: Physician Assistant | Admitting: Physician Assistant

## 2022-10-26 ENCOUNTER — Encounter: Payer: Self-pay | Admitting: Physician Assistant

## 2022-10-26 ENCOUNTER — Ambulatory Visit: Payer: BC Managed Care – PPO | Admitting: Physician Assistant

## 2022-10-26 VITALS — BP 110/83 | HR 90 | Wt 147.0 lb

## 2022-10-26 DIAGNOSIS — M7918 Myalgia, other site: Secondary | ICD-10-CM | POA: Diagnosis not present

## 2022-10-26 DIAGNOSIS — W108XXA Fall (on) (from) other stairs and steps, initial encounter: Secondary | ICD-10-CM | POA: Insufficient documentation

## 2022-10-26 DIAGNOSIS — Z043 Encounter for examination and observation following other accident: Secondary | ICD-10-CM | POA: Diagnosis not present

## 2022-10-26 DIAGNOSIS — R600 Localized edema: Secondary | ICD-10-CM | POA: Diagnosis not present

## 2022-10-26 NOTE — Progress Notes (Unsigned)
Established patient visit  Patient: Lauren Weaver   DOB: 1990-10-27   32 y.o. Female  MRN: [id number] Visit Date: 10/26/2022  Today's healthcare provider: Debera Lat, PA-C   Chief Complaint  Patient presents with   Injury   Subjective     Injury  Injury--right buttock-painful, bruise, aching/sore worse with pressure/walking--5 days. Tried ice and ibuprofen Last edited by Shelly Bombard, CMA on 10/26/2022 11:13 AM.         10/29/2021    8:14 AM 04/24/2020   11:12 AM  Depression screen PHQ 2/9  Decreased Interest 1 0  Down, Depressed, Hopeless 1 0  PHQ - 2 Score 2 0  Altered sleeping 1 0  Tired, decreased energy 2 0  Change in appetite 0 0  Feeling bad or failure about yourself  0 0  Trouble concentrating 0 0  Moving slowly or fidgety/restless 0 0  Suicidal thoughts 0 0  PHQ-9 Score 5 0  Difficult doing work/chores Not difficult at all Not difficult at all       No data to display          Medications: Outpatient Medications Prior to Visit  Medication Sig   cetirizine (ZYRTEC ALLERGY) 10 MG tablet Take 1 tablet (10 mg total) by mouth daily.   melatonin 5 MG TABS Take 5 mg by mouth at bedtime.   meloxicam (MOBIC) 15 MG tablet Take 1 tablet (15 mg total) by mouth daily. (Patient not taking: Reported on 10/26/2022)   No facility-administered medications prior to visit.    Review of Systems  All other systems reviewed and are negative.  Except see HPI   {Labs  Heme  Chem  Endocrine  Serology  Results Review (optional):23779}   Objective    BP 110/83 (BP Location: Right Arm, Patient Position: Sitting, Cuff Size: Normal)   Pulse 90   Wt 147 lb (66.7 kg)   SpO2 100%   BMI 26.04 kg/m  {Show previous vital signs (optional):23777}  Physical Exam Vitals reviewed.  Constitutional:      General: She is not in acute distress.    Appearance: Normal appearance. She is well-developed. She is not diaphoretic.  HENT:     Head: Normocephalic and  atraumatic.  Eyes:     General: No scleral icterus.       Right eye: No discharge.        Left eye: No discharge.     Extraocular Movements: Extraocular movements intact.     Conjunctiva/sclera: Conjunctivae normal.  Neck:     Thyroid: No thyromegaly.  Cardiovascular:     Rate and Rhythm: Normal rate and regular rhythm.     Pulses: Normal pulses.     Heart sounds: Normal heart sounds. No murmur heard. Pulmonary:     Effort: Pulmonary effort is normal. No respiratory distress.     Breath sounds: Normal breath sounds. No wheezing, rhonchi or rales.  Musculoskeletal:        General: Swelling (mild) and tenderness (mild tenderness on touch, no with movements) present.     Cervical back: Neck supple.     Right lower leg: No edema.     Left lower leg: No edema.  Lymphadenopathy:     Cervical: No cervical adenopathy.  Skin:    General: Skin is warm and dry.     Capillary Refill: Capillary refill takes less than 2 seconds.     Findings: Bruising (large bruise over the entire right buttock area) and erythema (right buttock) present.  No lesion or rash.  Neurological:     Mental Status: She is alert and oriented to person, place, and time. Mental status is at baseline.     Sensory: No sensory deficit.     Motor: No weakness.     Coordination: Coordination normal.     Gait: Gait normal.     Deep Tendon Reflexes: Reflexes normal.  Psychiatric:        Behavior: Behavior normal.        Thought Content: Thought content normal.        Judgment: Judgment normal.      No results found for any visits on 10/26/22.  Assessment & Plan    1. Acute buttock pain 2. Fall (on) (from) other stairs and steps, initial encounter Acute, most recent Improving in pain, swelling, bruising but she has been having concerns about redness and warmth on touch of the right buttock area Advised to proceed with Ice/heat, OTC pain medications Initial workup - DG Lumbar Spine Complete; Future - DG HIP UNILAT W  OR W/O PELVIS 2-3 VIEWS RIGHT; Future - Comprehensive metabolic panel - CBC with Differential/Platelet Will reassess after  receiving lab results  No follow-ups on file.     The patient was advised to call back or seek an in-person evaluation if the symptoms worsen or if the condition fails to improve as anticipated.  I discussed the assessment and treatment plan with the patient. The patient was provided an opportunity to ask questions and all were answered. The patient agreed with the plan and demonstrated an understanding of the instructions.  I, Debera Lat, PA-C have reviewed all documentation for this visit. The documentation on 10/26/22 for the exam, diagnosis, procedures, and orders are all accurate and complete.  Debera Lat, [facility], MMS [facility] [phone] (phone) [phone] (fax)  [facility]

## 2022-10-26 NOTE — Telephone Encounter (Signed)
      Chief Complaint: Larey Seat last Thursday on buttocks. Concerned because bruising now looks red. Appointment made. Symptoms: Above Frequency: Last Thursday Pertinent Negatives: Patient denies laceration Disposition: [] ED /[] Urgent Care (no appt availability in office) / [x] Appointment(In office/virtual)/ []  [place] [facility]/ [] Home Care/ [] Refused Recommended Disposition /[] [place] [facility]/ []  Follow-up with PCP Additional Notes:   Reason for Disposition  Small bruise is present  Answer Assessment - Initial Assessment Questions 1. MECHANISM: "How did the fall happen?"     Fell down 4 steps on bottom 2. DOMESTIC VIOLENCE AND ELDER ABUSE SCREENING: "Did you fall because someone pushed you or tried to hurt you?" If Yes, ask: "Are you safe now?"     No 3. ONSET: "When did the fall happen?" (e.g., minutes, hours, or days ago)     Thursday 4. LOCATION: "What part of the body hit the ground?" (e.g., back, buttocks, head, hips, knees, hands, head, stomach)     Buttocks 5. INJURY: "Did you hurt (injure) yourself when you fell?" If Yes, ask: "What did you injure? Tell me more about this?" (e.g., body area; type of injury; pain severity)"     Yes 6. PAIN: "Is there any pain?" If Yes, ask: "How bad is the pain?" (e.g., Scale 1-10; or mild,  moderate, severe)   - NONE (0): No pain   - MILD (1-3): Doesn't interfere with normal activities    - MODERATE (4-7): Interferes with normal activities or awakens from sleep    - SEVERE (8-10): Excruciating pain, unable to do any normal activities      Now - 5-6 7. SIZE: For cuts, bruises, or swelling, ask: "How large is it?" (e.g., inches or centimeters)      No 8. PREGNANCY: "Is there any chance you are pregnant?" "When was your last menstrual period?"     No 9. OTHER SYMPTOMS: "Do you have any other symptoms?" (e.g., dizziness, fever, weakness; new onset or worsening).      No 10. CAUSE: "What do you think caused the fall (or falling)?"  (e.g., tripped, dizzy spell)       Tripped  Protocols used: Falls and [facility]

## 2022-10-27 LAB — CBC WITH DIFFERENTIAL/PLATELET
Basophils Absolute: 0.1 10*3/uL (ref 0.0–0.2)
Basos: 1 %
EOS (ABSOLUTE): 0.1 10*3/uL (ref 0.0–0.4)
Eos: 1 %
Hematocrit: 39.2 % (ref 34.0–46.6)
Hemoglobin: 13.3 g/dL (ref 11.1–15.9)
Immature Grans (Abs): 0 10*3/uL (ref 0.0–0.1)
Immature Granulocytes: 0 %
Lymphocytes Absolute: 2.7 10*3/uL (ref 0.7–3.1)
Lymphs: 32 %
MCH: 30.4 pg (ref 26.6–33.0)
MCHC: 33.9 g/dL (ref 31.5–35.7)
MCV: 90 fL (ref 79–97)
Monocytes Absolute: 0.5 10*3/uL (ref 0.1–0.9)
Monocytes: 7 %
Neutrophils Absolute: 5 10*3/uL (ref 1.4–7.0)
Neutrophils: 59 %
Platelets: 316 10*3/uL (ref 150–450)
RBC: 4.38 x10E6/uL (ref 3.77–5.28)
RDW: 11.5 % — ABNORMAL LOW (ref 11.7–15.4)
WBC: 8.4 10*3/uL (ref 3.4–10.8)

## 2022-10-27 LAB — COMPREHENSIVE METABOLIC PANEL
ALT: 17 IU/L (ref 0–32)
AST: 25 IU/L (ref 0–40)
Albumin/Globulin Ratio: 2 (ref 1.2–2.2)
Albumin: 4.9 g/dL (ref 4.0–5.0)
Alkaline Phosphatase: 69 IU/L (ref 44–121)
BUN/Creatinine Ratio: 17 (ref 9–23)
BUN: 12 mg/dL (ref 6–20)
Bilirubin Total: 0.7 mg/dL (ref 0.0–1.2)
CO2: 22 mmol/L (ref 20–29)
Calcium: 9.7 mg/dL (ref 8.7–10.2)
Chloride: 104 mmol/L (ref 96–106)
Creatinine, Ser: 0.72 mg/dL (ref 0.57–1.00)
Globulin, Total: 2.5 g/dL (ref 1.5–4.5)
Glucose: 77 mg/dL (ref 70–99)
Potassium: 4.4 mmol/L (ref 3.5–5.2)
Sodium: 140 mmol/L (ref 134–144)
Total Protein: 7.4 g/dL (ref 6.0–8.5)
eGFR: 116 mL/min/{1.73_m2} (ref >59)

## 2022-10-27 NOTE — Progress Notes (Signed)
Please, let pt know that all her labs were WNL. I will let her know the situation with imaging .

## 2022-10-28 DIAGNOSIS — F800 Phonological disorder: Secondary | ICD-10-CM | POA: Diagnosis not present

## 2022-10-28 DIAGNOSIS — F411 Generalized anxiety disorder: Secondary | ICD-10-CM | POA: Diagnosis not present

## 2022-10-28 DIAGNOSIS — M2659 Other dentofacial functional abnormalities: Secondary | ICD-10-CM | POA: Diagnosis not present

## 2022-11-02 NOTE — Progress Notes (Signed)
Called and left a message to pt that her imaging were negative

## 2022-11-08 ENCOUNTER — Ambulatory Visit: Payer: BC Managed Care – PPO | Admitting: Family Medicine

## 2022-11-08 VITALS — BP 114/74 | HR 62 | Temp 98.3°F | Wt 145.0 lb

## 2022-11-08 DIAGNOSIS — S300XXA Contusion of lower back and pelvis, initial encounter: Secondary | ICD-10-CM

## 2022-11-08 DIAGNOSIS — W108XXD Fall (on) (from) other stairs and steps, subsequent encounter: Secondary | ICD-10-CM

## 2022-11-08 DIAGNOSIS — M7918 Myalgia, other site: Secondary | ICD-10-CM | POA: Diagnosis not present

## 2022-11-08 NOTE — Patient Instructions (Addendum)
Use heat as tolerated to encourage break down/reabsorption of the blood clot.  Do not sleep with a heating pad on as this will result in burns.  Engage in gentle exercises to use the muscles of the buttock, such as supine bicycles and/or body weight squats, to encourage break down/reabsorption of the blood clot and facilitate return to running  Continue to use OTC pain relievers as needed.  Walking is fine as long as it does not cause increased pain.  This will take many months to heal completely as it is a large area for the body to breakdown and reabsorb.  For reference, a normal bruise takes several weeks.   Feel free to send a message via MyChart or call if you have any questions.

## 2022-11-08 NOTE — Progress Notes (Signed)
Vivien Rota DeSanto,acting as a Neurosurgeon for [employer], DO.,have documented all relevant documentation on the behalf of [employer], DO,as directed by  [employer], DO while in the presence of Sherlyn Hay, DO.     Established patient visit   Patient: Lauren Weaver   DOB: 1991-04-20   32 y.o. Female  MRN: [id number] Visit Date: 11/08/2022  Today's healthcare provider: Sherlyn Hay, DO   Chief Complaint  Patient presents with   Follow-up   Muscle Pain   Subjective    HPI  Patient is a 32 year old female who presents for follow up after her fall.  She reports she is getting a lot better but is not where she thinks she should be.  She wants to make sure she is still healing as she should. Patient states she uses Ibuprofen for discomfort with relief.  She notes that the swelling seems to have gone down but she cannot run due to pain after about 10 steps.  She also endorses reduced coordination with running, which she describes as feeling lop-sided.  She first tried running again approximately 4 to 5 days ago.  Prior to her injury, she ran 3 miles regularly.  No numbness/tingling Walking is fine/without problems. No trouble going up or down steps Sometimes does strength training at the gym. Sometimes does elliptical; has not done this or prolonged walking since prior to her fall.  Did OTC ibuprofen twice per day 2 200 mg tabs and tylenol once per day 325 mg. She has been applying ice once per day, at night.   Medications: Outpatient Medications Prior to Visit  Medication Sig   cetirizine (ZYRTEC ALLERGY) 10 MG tablet Take 1 tablet (10 mg total) by mouth daily.   melatonin 5 MG TABS Take 5 mg by mouth at bedtime.   meloxicam (MOBIC) 15 MG tablet Take 1 tablet (15 mg total) by mouth daily. (Patient not taking: Reported on 10/26/2022)   No facility-administered medications prior to visit.    Review of Systems  Musculoskeletal:  Negative for back pain and gait  problem.       Buttocks pain-right       Objective    BP 114/74 (BP Location: Right Arm, Patient Position: Sitting, Cuff Size: Normal)   Pulse 62   Temp 98.3 F (36.8 C) (Oral)   Wt 145 lb (65.8 kg)   SpO2 99%   BMI 25.69 kg/m    Physical Exam Vitals reviewed.  Constitutional:      General: She is not in acute distress.    Appearance: She is well-developed.  HENT:     Head: Normocephalic and atraumatic.  Eyes:     General: No scleral icterus.    Conjunctiva/sclera: Conjunctivae normal.  Cardiovascular:     Rate and Rhythm: Normal rate and regular rhythm.  Pulmonary:     Effort: Pulmonary effort is normal. No respiratory distress.  Musculoskeletal:       Legs:     Comments: Old hematoma on right buttock, elevated approximately 1 cm from surface of skin contour; healing area of ecchymosis to right upper lateral thigh (as noted).  Skin:    General: Skin is warm and dry.     Findings: No rash.  Neurological:     Mental Status: She is alert and oriented to person, place, and time.  Psychiatric:        Behavior: Behavior normal.      No results found for any  visits on 11/08/22.  Assessment & Plan     1. Right buttock pain Patient appears to be healing well and has evidence of that healing at her right upper thigh.  On her right lateral buttock, she continues to exhibit a hematoma.  Explained to her that this will likely take several months to heal and that she can use heat to the area to try to increase resorption, as well as alleviate pain.  She also may continue to use OTC pain relievers as needed.  Encouraged her to try to gently use the muscles in the area with supine bicycles and/or body weight squats, as tolerated, to facilitate breaking up the blood clot and to help reduce the time it will take her to get back to running.  Advised her that walking is fine, since it does not cause her any discomfort.  Patient to follow-up as needed.  2. Fall (on) (from) other  stairs and steps, subsequent encounter As above.  3. Hematoma of buttock As above.    No follow-ups on file.   Encouraged patient to schedule annual exam, as her last one was November 2023.  The entirety of the information documented in the History of Present Illness, Review of Systems and Physical Exam were personally obtained by me. Portions of this information were initially documented by the CMA, Adline Peals, and reviewed by me for thoroughness and accuracy.   I discussed the assessment and treatment plan with the patient  The patient was provided an opportunity to ask questions and all were answered. The patient agreed with the plan and demonstrated an understanding of the instructions.   The patient was advised to call back or seek an in-person evaluation if the symptoms worsen or if the condition fails to improve as anticipated.    Sherlyn Hay, DO  [facility] [phone] (phone) [phone] (fax)  [facility]

## 2022-11-09 ENCOUNTER — Ambulatory Visit: Payer: BC Managed Care – PPO | Admitting: Physician Assistant

## 2022-11-15 DIAGNOSIS — M2659 Other dentofacial functional abnormalities: Secondary | ICD-10-CM | POA: Diagnosis not present

## 2022-11-15 DIAGNOSIS — F800 Phonological disorder: Secondary | ICD-10-CM | POA: Diagnosis not present

## 2022-11-29 DIAGNOSIS — M2659 Other dentofacial functional abnormalities: Secondary | ICD-10-CM | POA: Diagnosis not present

## 2022-11-29 DIAGNOSIS — F800 Phonological disorder: Secondary | ICD-10-CM | POA: Diagnosis not present

## 2022-12-13 DIAGNOSIS — F800 Phonological disorder: Secondary | ICD-10-CM | POA: Diagnosis not present

## 2022-12-13 DIAGNOSIS — M2659 Other dentofacial functional abnormalities: Secondary | ICD-10-CM | POA: Diagnosis not present

## 2022-12-14 DIAGNOSIS — F411 Generalized anxiety disorder: Secondary | ICD-10-CM | POA: Diagnosis not present

## 2022-12-27 DIAGNOSIS — F800 Phonological disorder: Secondary | ICD-10-CM | POA: Diagnosis not present

## 2022-12-27 DIAGNOSIS — M2659 Other dentofacial functional abnormalities: Secondary | ICD-10-CM | POA: Diagnosis not present

## 2022-12-30 DIAGNOSIS — F411 Generalized anxiety disorder: Secondary | ICD-10-CM | POA: Diagnosis not present

## 2023-01-03 DIAGNOSIS — M2659 Other dentofacial functional abnormalities: Secondary | ICD-10-CM | POA: Diagnosis not present

## 2023-01-03 DIAGNOSIS — F800 Phonological disorder: Secondary | ICD-10-CM | POA: Diagnosis not present

## 2023-01-10 DIAGNOSIS — F800 Phonological disorder: Secondary | ICD-10-CM | POA: Diagnosis not present

## 2023-01-10 DIAGNOSIS — M2659 Other dentofacial functional abnormalities: Secondary | ICD-10-CM | POA: Diagnosis not present

## 2023-01-13 DIAGNOSIS — F411 Generalized anxiety disorder: Secondary | ICD-10-CM | POA: Diagnosis not present

## 2023-01-20 DIAGNOSIS — M2659 Other dentofacial functional abnormalities: Secondary | ICD-10-CM | POA: Diagnosis not present

## 2023-01-20 DIAGNOSIS — F800 Phonological disorder: Secondary | ICD-10-CM | POA: Diagnosis not present

## 2023-01-24 DIAGNOSIS — M2659 Other dentofacial functional abnormalities: Secondary | ICD-10-CM | POA: Diagnosis not present

## 2023-01-24 DIAGNOSIS — F800 Phonological disorder: Secondary | ICD-10-CM | POA: Diagnosis not present

## 2023-01-27 DIAGNOSIS — F411 Generalized anxiety disorder: Secondary | ICD-10-CM | POA: Diagnosis not present

## 2023-01-31 DIAGNOSIS — M2659 Other dentofacial functional abnormalities: Secondary | ICD-10-CM | POA: Diagnosis not present

## 2023-01-31 DIAGNOSIS — F800 Phonological disorder: Secondary | ICD-10-CM | POA: Diagnosis not present

## 2023-02-09 DIAGNOSIS — M2659 Other dentofacial functional abnormalities: Secondary | ICD-10-CM | POA: Diagnosis not present

## 2023-02-09 DIAGNOSIS — F800 Phonological disorder: Secondary | ICD-10-CM | POA: Diagnosis not present

## 2023-02-10 DIAGNOSIS — F411 Generalized anxiety disorder: Secondary | ICD-10-CM | POA: Diagnosis not present

## 2023-02-14 ENCOUNTER — Encounter: Payer: BC Managed Care – PPO | Admitting: Family Medicine

## 2023-02-14 DIAGNOSIS — F800 Phonological disorder: Secondary | ICD-10-CM | POA: Diagnosis not present

## 2023-02-14 DIAGNOSIS — M2659 Other dentofacial functional abnormalities: Secondary | ICD-10-CM | POA: Diagnosis not present

## 2023-02-14 NOTE — Progress Notes (Deleted)
   Complete physical exam  Patient: Lauren Weaver   DOB: 09-Apr-1991   32 y.o. Female  MRN: [id number]  Subjective:    No chief complaint on file.   Lauren Weaver is a 32 y.o. female who presents today for a complete physical exam. She reports consuming a {diet types:17450} diet. {types:19826} She generally feels {DESC; WELL/FAIRLY WELL/POORLY:18703}. She reports sleeping {DESC; WELL/FAIRLY WELL/POORLY:18703}. She {does/does not:200015} have additional problems to discuss today.    Discussed the use of AI scribe software for clinical note transcription with the patient, who gave verbal consent to proceed.  History of Present Illness           Most recent fall risk assessment:    10/26/2022   11:18 AM  Fall Risk   Falls in the past year? 1  Number falls in past yr: 0  Injury with Fall? 1     Most recent depression screenings:    10/26/2022   11:19 AM 10/29/2021    8:14 AM  PHQ 2/9 Scores  PHQ - 2 Score 0 2  PHQ- 9 Score 0 5    {VISON DENTAL STD PSA (Optional):27386}  {History (Optional):23778}  Patient Care Team: Erasmo Downer, MD as PCP - General (Family Medicine)   Outpatient Medications Prior to Visit  Medication Sig   cetirizine (ZYRTEC ALLERGY) 10 MG tablet Take 1 tablet (10 mg total) by mouth daily.   melatonin 5 MG TABS Take 5 mg by mouth at bedtime.   meloxicam (MOBIC) 15 MG tablet Take 1 tablet (15 mg total) by mouth daily. (Patient not taking: Reported on 10/26/2022)   No facility-administered medications prior to visit.    ROS per HPI      Objective:     There were no vitals taken for this visit. {Vitals History (Optional):23777}  Physical Exam   No results found for any visits on 02/14/23. {Show previous labs (optional):23779}    Assessment & Plan:    Routine Health Maintenance and Physical Exam  Immunization History  Administered Date(s) Administered   Influenza, Quadrivalent, Recombinant, Inj, Pf 05/18/2018    Influenza,inj,Quad PF,6+ Mos 04/24/2020   Moderna Sars-Covid-2 Vaccination 01/31/2020, 03/01/2020   Tdap 08/31/2020    Health Maintenance  Topic Date Due   PAP SMEAR-Modifier  Never done   COVID-19 Vaccine (3 - Moderna risk series) 03/29/2020   INFLUENZA VACCINE  01/06/2023   DTaP/Tdap/Td (2 - Td or Tdap) 09/01/2030   Hepatitis C Screening  Completed   HIV Screening  Completed   HPV VACCINES  Aged Out    Discussed health benefits of physical activity, and encouraged her to engage in regular exercise appropriate for her age and condition.  Problem List Items Addressed This Visit   None Visit Diagnoses     Annual physical exam    -  Primary                  No follow-ups on file.     Shirlee Latch, MD

## 2023-02-21 DIAGNOSIS — F800 Phonological disorder: Secondary | ICD-10-CM | POA: Diagnosis not present

## 2023-02-21 DIAGNOSIS — M2659 Other dentofacial functional abnormalities: Secondary | ICD-10-CM | POA: Diagnosis not present

## 2023-02-22 DIAGNOSIS — F411 Generalized anxiety disorder: Secondary | ICD-10-CM | POA: Diagnosis not present

## 2023-02-28 DIAGNOSIS — F800 Phonological disorder: Secondary | ICD-10-CM | POA: Diagnosis not present

## 2023-02-28 DIAGNOSIS — M2659 Other dentofacial functional abnormalities: Secondary | ICD-10-CM | POA: Diagnosis not present

## 2023-03-07 DIAGNOSIS — F800 Phonological disorder: Secondary | ICD-10-CM | POA: Diagnosis not present

## 2023-03-07 DIAGNOSIS — M2659 Other dentofacial functional abnormalities: Secondary | ICD-10-CM | POA: Diagnosis not present

## 2023-03-10 DIAGNOSIS — F411 Generalized anxiety disorder: Secondary | ICD-10-CM | POA: Diagnosis not present

## 2023-03-14 DIAGNOSIS — M2659 Other dentofacial functional abnormalities: Secondary | ICD-10-CM | POA: Diagnosis not present

## 2023-03-14 DIAGNOSIS — F800 Phonological disorder: Secondary | ICD-10-CM | POA: Diagnosis not present

## 2023-03-28 DIAGNOSIS — F800 Phonological disorder: Secondary | ICD-10-CM | POA: Diagnosis not present

## 2023-03-28 DIAGNOSIS — M2659 Other dentofacial functional abnormalities: Secondary | ICD-10-CM | POA: Diagnosis not present

## 2023-04-06 DIAGNOSIS — F800 Phonological disorder: Secondary | ICD-10-CM | POA: Diagnosis not present

## 2023-04-06 DIAGNOSIS — M2659 Other dentofacial functional abnormalities: Secondary | ICD-10-CM | POA: Diagnosis not present

## 2023-04-11 DIAGNOSIS — M2659 Other dentofacial functional abnormalities: Secondary | ICD-10-CM | POA: Diagnosis not present

## 2023-04-11 DIAGNOSIS — F800 Phonological disorder: Secondary | ICD-10-CM | POA: Diagnosis not present

## 2023-04-20 DIAGNOSIS — M2659 Other dentofacial functional abnormalities: Secondary | ICD-10-CM | POA: Diagnosis not present

## 2023-04-20 DIAGNOSIS — F800 Phonological disorder: Secondary | ICD-10-CM | POA: Diagnosis not present

## 2023-04-25 DIAGNOSIS — M2659 Other dentofacial functional abnormalities: Secondary | ICD-10-CM | POA: Diagnosis not present

## 2023-04-25 DIAGNOSIS — F800 Phonological disorder: Secondary | ICD-10-CM | POA: Diagnosis not present

## 2023-05-02 DIAGNOSIS — F800 Phonological disorder: Secondary | ICD-10-CM | POA: Diagnosis not present

## 2023-05-02 DIAGNOSIS — M2659 Other dentofacial functional abnormalities: Secondary | ICD-10-CM | POA: Diagnosis not present

## 2023-05-09 DIAGNOSIS — M2659 Other dentofacial functional abnormalities: Secondary | ICD-10-CM | POA: Diagnosis not present

## 2023-05-09 DIAGNOSIS — F800 Phonological disorder: Secondary | ICD-10-CM | POA: Diagnosis not present

## 2023-05-24 DIAGNOSIS — M2659 Other dentofacial functional abnormalities: Secondary | ICD-10-CM | POA: Diagnosis not present

## 2023-05-24 DIAGNOSIS — F800 Phonological disorder: Secondary | ICD-10-CM | POA: Diagnosis not present

## 2023-05-30 DIAGNOSIS — F800 Phonological disorder: Secondary | ICD-10-CM | POA: Diagnosis not present

## 2023-05-30 DIAGNOSIS — M2659 Other dentofacial functional abnormalities: Secondary | ICD-10-CM | POA: Diagnosis not present

## 2023-06-13 DIAGNOSIS — M2659 Other dentofacial functional abnormalities: Secondary | ICD-10-CM | POA: Diagnosis not present

## 2023-06-13 DIAGNOSIS — F800 Phonological disorder: Secondary | ICD-10-CM | POA: Diagnosis not present

## 2023-06-15 DIAGNOSIS — F4010 Social phobia, unspecified: Secondary | ICD-10-CM | POA: Diagnosis not present

## 2023-06-15 DIAGNOSIS — F4312 Post-traumatic stress disorder, chronic: Secondary | ICD-10-CM | POA: Diagnosis not present

## 2023-06-15 DIAGNOSIS — F411 Generalized anxiety disorder: Secondary | ICD-10-CM | POA: Diagnosis not present

## 2023-06-20 ENCOUNTER — Other Ambulatory Visit (HOSPITAL_COMMUNITY)
Admission: RE | Admit: 2023-06-20 | Discharge: 2023-06-20 | Disposition: A | Discharge status: Home / Self Care | Payer: BC Managed Care – PPO | Source: Ambulatory Visit | Attending: Family Medicine | Admitting: Family Medicine

## 2023-06-20 ENCOUNTER — Ambulatory Visit (INDEPENDENT_AMBULATORY_CARE_PROVIDER_SITE_OTHER): Payer: BC Managed Care – PPO | Admitting: Family Medicine

## 2023-06-20 ENCOUNTER — Encounter: Payer: Self-pay | Admitting: Family Medicine

## 2023-06-20 VITALS — BP 127/80 | HR 67 | Ht 63.0 in | Wt 145.1 lb

## 2023-06-20 DIAGNOSIS — Z124 Encounter for screening for malignant neoplasm of cervix: Secondary | ICD-10-CM

## 2023-06-20 DIAGNOSIS — F411 Generalized anxiety disorder: Secondary | ICD-10-CM

## 2023-06-20 DIAGNOSIS — Z13220 Encounter for screening for lipoid disorders: Secondary | ICD-10-CM | POA: Diagnosis not present

## 2023-06-20 DIAGNOSIS — Z0000 Encounter for general adult medical examination without abnormal findings: Secondary | ICD-10-CM | POA: Diagnosis not present

## 2023-06-20 DIAGNOSIS — F3342 Major depressive disorder, recurrent, in full remission: Secondary | ICD-10-CM | POA: Diagnosis not present

## 2023-06-20 DIAGNOSIS — Z113 Encounter for screening for infections with a predominantly sexual mode of transmission: Secondary | ICD-10-CM | POA: Insufficient documentation

## 2023-06-20 DIAGNOSIS — F800 Phonological disorder: Secondary | ICD-10-CM | POA: Diagnosis not present

## 2023-06-20 DIAGNOSIS — F329 Major depressive disorder, single episode, unspecified: Secondary | ICD-10-CM | POA: Insufficient documentation

## 2023-06-20 DIAGNOSIS — Z30432 Encounter for removal of intrauterine contraceptive device: Secondary | ICD-10-CM

## 2023-06-20 DIAGNOSIS — M2659 Other dentofacial functional abnormalities: Secondary | ICD-10-CM | POA: Diagnosis not present

## 2023-06-20 MED ORDER — ESCITALOPRAM OXALATE 10 MG PO TABS: 10.0000 mg | ORAL_TABLET | Freq: Every day | ORAL | 1 refills | Status: DC

## 2023-06-20 NOTE — Assessment & Plan Note (Signed)
 Generalized Anxiety Disorder and Depression Using Lexapro  10 mg for anxiety and depression, effective. Requests local prescription for insurance coverage, prefers 90-day supply. - Prescribe Lexapro  10 mg, 90-day supply, to be filled at [facility] - Schedule virtual follow-up in six months to monitor effectiveness of Lexapro 

## 2023-06-20 NOTE — Progress Notes (Signed)
 Complete physical exam   Patient: Lauren Weaver   DOB: [birth year]   32 y.o. Female  MRN: [id number] Visit Date: 06/20/2023  Today's healthcare provider: Jon Eva, MD   Chief Complaint  Patient presents with   Annual Exam    Diet - General, well balanced Exercise- 5 days a week for 30-45 minutes Feeling - well Sleeping- sleeping Concerns - Would like her lexapro  to be handled in office and not virtual anymore. As well as she would like to discuss having her IUD removed. Reports having it inserted 6 years ago and [facility]   Subjective    Lauren Weaver is a 33 y.o. female who presents today for a complete physical exam.    Discussed the use of AI scribe software for clinical note transcription with the patient, who gave verbal consent to proceed.  History of Present Illness   The patient, with a history of anxiety and depression, presents for a physical examination and Pap smear. She has been managing her symptoms with Lexapro  10mg , which she initially obtained through an online service. She reports that the medication has been effective in managing her symptoms. The patient also has an IUD which she wishes to have removed, expressing a desire to be done with it. She mentions that her husband recently underwent a mastectomy and she plans to do the same in the future. The patient is also due for a flu shot and is aware of the COVID boosters.        Last depression screening scores    06/20/2023   10:50 AM 10/26/2022   11:19 AM 10/29/2021    8:14 AM  PHQ 2/9 Scores  PHQ - 2 Score 2 0 2  PHQ- 9 Score 6 0 5   Last fall risk screening    06/20/2023   10:50 AM  Fall Risk   Falls in the past year? 1  Number falls in past yr: 0  Injury with Fall? 0  Risk for fall due to : No Fall Risks  Follow up Falls evaluation completed        Medications: Outpatient Medications Prior to Visit  Medication Sig   cetirizine  (ZYRTEC  ALLERGY ) 10 MG tablet  Take 1 tablet (10 mg total) by mouth daily.   melatonin 5 MG TABS Take 5 mg by mouth at bedtime.   [DISCONTINUED] escitalopram  (LEXAPRO ) 10 MG tablet Take 10 mg by mouth daily.   meloxicam  (MOBIC ) 15 MG tablet Take 1 tablet (15 mg total) by mouth daily. (Patient not taking: Reported on 06/20/2023)   No facility-administered medications prior to visit.    Review of Systems    Objective    BP 127/80 (Cuff Size: Normal)   Pulse 67   Ht 5' 3 (1.6 m)   Wt 145 lb 1.6 oz (65.8 kg)   SpO2 100%   BMI 25.70 kg/m    Physical Exam Vitals reviewed.  Constitutional:      General: She is not in acute distress.    Appearance: Normal appearance. She is well-developed. She is not diaphoretic.  HENT:     Head: Normocephalic and atraumatic.     Right Ear: Tympanic membrane, ear canal and external ear normal.     Left Ear: Tympanic membrane, ear canal and external ear normal.     Nose: Nose normal.     Mouth/Throat:     Mouth: Mucous membranes are moist.     Pharynx: Oropharynx is clear. No oropharyngeal exudate.  Eyes:     General: No scleral icterus.    Conjunctiva/sclera: Conjunctivae normal.     Pupils: Pupils are equal, round, and reactive to light.  Neck:     Thyroid : No thyromegaly.  Cardiovascular:     Rate and Rhythm: Normal rate and regular rhythm.     Heart sounds: Normal heart sounds. No murmur heard. Pulmonary:     Effort: Pulmonary effort is normal. No respiratory distress.     Breath sounds: Normal breath sounds. No wheezing or rales.  Abdominal:     General: There is no distension.     Palpations: Abdomen is soft.     Tenderness: There is no abdominal tenderness.  Musculoskeletal:        General: No deformity.     Cervical back: Neck supple.     Right lower leg: No edema.     Left lower leg: No edema.  Lymphadenopathy:     Cervical: No cervical adenopathy.  Skin:    General: Skin is warm and dry.     Findings: No rash.  Neurological:     Mental Status: She is  alert and oriented to person, place, and time. Mental status is at baseline.     Gait: Gait normal.  Psychiatric:        Mood and Affect: Mood normal.        Behavior: Behavior normal.        Thought Content: Thought content normal.      No results found for any visits on 06/20/23.  Assessment & Plan    Routine Health Maintenance and Physical Exam  Exercise Activities and Dietary recommendations  Goals   None     Immunization History  Administered Date(s) Administered   Influenza, Quadrivalent, Recombinant, Inj, Pf 05/18/2018   Influenza,inj,Quad PF,6+ Mos 04/24/2020   Moderna Sars-Covid-2 Vaccination 01/31/2020, 03/01/2020   Tdap 08/31/2020    Health Maintenance  Topic Date Due   Cervical Cancer Screening (HPV/Pap Cotest)  Never done   INFLUENZA VACCINE  01/06/2023   COVID-19 Vaccine (3 - 2024-25 season) 02/06/2023   DTaP/Tdap/Td (2 - Td or Tdap) 09/01/2030   Hepatitis C Screening  Completed   HIV Screening  Completed   HPV VACCINES  Aged Out    Discussed health benefits of physical activity, and encouraged her to engage in regular exercise appropriate for her age and condition.  Problem List Items Addressed This Visit       Other   GAD (generalized anxiety disorder)   Generalized Anxiety Disorder and Depression Using Lexapro  10 mg for anxiety and depression, effective. Requests local prescription for insurance coverage, prefers 90-day supply. - Prescribe Lexapro  10 mg, 90-day supply, to be filled at [facility] - Schedule virtual follow-up in six months to monitor effectiveness of Lexapro       Relevant Medications   escitalopram  (LEXAPRO ) 10 MG tablet   MDD (major depressive disorder)   Generalized Anxiety Disorder and Depression Using Lexapro  10 mg for anxiety and depression, effective. Requests local prescription for insurance coverage, prefers 90-day supply. - Prescribe Lexapro  10 mg, 90-day supply, to be filled at [facility] - Schedule virtual  follow-up in six months to monitor effectiveness of Lexapro       Relevant Medications   escitalopram  (LEXAPRO ) 10 MG tablet   Other Visit Diagnoses       Encounter for annual physical exam    -  Primary   Relevant Orders   Comprehensive metabolic panel   Lipid Panel With  LDL/HDL Ratio   CBC w/Diff/Platelet     Cervical cancer screening       Relevant Orders   Cytology - PAP     Screening for lipid disorders       Relevant Orders   Lipid Panel With LDL/HDL Ratio     Encounter for IUD removal               Contraceptive Device Removal Requests removal of IUD during today's visit for convenience with Pap smear. Husband had vasectomy. No other contraceptive needed at this time  - Remove IUD during Pap smear procedure  Routine Physical Examination Here for a routine physical examination. No acute complaints or significant findings. Discussed benefits of combining Pap smear with HPV co-testing, extending interval to five years if normal. Agreed to STD testing and flu shot today. Discussed importance of regular screenings and vaccinations, including COVID-19 boosters. Agreed to blood tests including blood counts, kidney and liver function, blood sugar, and cholesterol. - Perform routine physical examination - Perform Pap smear with HPV co-testing - Perform STD testing - Administer flu shot - forgot to give at end of visit - can come back for it or do it at the pharmacy - Order blood tests including blood counts, kidney and liver function, blood sugar, and cholesterol  General Health Maintenance Up-to-date with most health maintenance activities. Discussed importance of regular screenings and vaccinations. - Recommend mammogram starting at age 31 - Discuss COVID-19 boosters.      IUD removal: Procedure note Patient was given informed consent, signed copy in the chart. Appropriate time out was taken. IUD strings easily visualized from external os.  Strings grasped with forceps and  with patient giving increased abd pressure, strings pulled and IUD easily removed intact.  Patient tolerated procedure well.  Discussed return precautions.     Return in about 6 months (around 12/18/2023) for MDD/GAD f/u, virtual ok.     Jon Eva, MD  [facility] [phone] (phone) [phone] (fax)  [facility]

## 2023-06-21 LAB — COMPREHENSIVE METABOLIC PANEL
ALT: 17 [IU]/L (ref 0–32)
AST: 19 [IU]/L (ref 0–40)
Albumin: 4.9 g/dL (ref 4.0–5.0)
Alkaline Phosphatase: 54 [IU]/L (ref 44–121)
BUN/Creatinine Ratio: 7 — ABNORMAL LOW (ref 9–23)
BUN: 5 mg/dL — ABNORMAL LOW (ref 6–20)
Bilirubin Total: 0.5 mg/dL (ref 0.0–1.2)
CO2: 22 mmol/L (ref 20–29)
Calcium: 9.6 mg/dL (ref 8.7–10.2)
Chloride: 104 mmol/L (ref 96–106)
Creatinine, Ser: 0.7 mg/dL (ref 0.57–1.00)
Globulin, Total: 2.2 g/dL (ref 1.5–4.5)
Glucose: 97 mg/dL (ref 70–99)
Potassium: 4.2 mmol/L (ref 3.5–5.2)
Sodium: 141 mmol/L (ref 134–144)
Total Protein: 7.1 g/dL (ref 6.0–8.5)
eGFR: 119 mL/min/{1.73_m2} (ref >59)

## 2023-06-21 LAB — CBC WITH DIFFERENTIAL/PLATELET
Basophils Absolute: 0 10*3/uL (ref 0.0–0.2)
Basos: 1 %
EOS (ABSOLUTE): 0.1 10*3/uL (ref 0.0–0.4)
Eos: 3 %
Hematocrit: 39.2 % (ref 34.0–46.6)
Hemoglobin: 13.1 g/dL (ref 11.1–15.9)
Immature Grans (Abs): 0 10*3/uL (ref 0.0–0.1)
Immature Granulocytes: 0 %
Lymphocytes Absolute: 2 10*3/uL (ref 0.7–3.1)
Lymphs: 36 %
MCH: 30.8 pg (ref 26.6–33.0)
MCHC: 33.4 g/dL (ref 31.5–35.7)
MCV: 92 fL (ref 79–97)
Monocytes Absolute: 0.4 10*3/uL (ref 0.1–0.9)
Monocytes: 7 %
Neutrophils Absolute: 3 10*3/uL (ref 1.4–7.0)
Neutrophils: 53 %
Platelets: 340 10*3/uL (ref 150–450)
RBC: 4.26 x10E6/uL (ref 3.77–5.28)
RDW: 11.1 % — ABNORMAL LOW (ref 11.7–15.4)
WBC: 5.5 10*3/uL (ref 3.4–10.8)

## 2023-06-21 LAB — LIPID PANEL WITH LDL/HDL RATIO
Cholesterol, Total: 200 mg/dL — ABNORMAL HIGH (ref 100–199)
HDL: 64 mg/dL (ref >39)
LDL Chol Calc (NIH): 123 mg/dL — ABNORMAL HIGH (ref 0–99)
LDL/HDL Ratio: 1.9 {ratio} (ref 0.0–3.2)
Triglycerides: 74 mg/dL (ref 0–149)
VLDL Cholesterol Cal: 13 mg/dL (ref 5–40)

## 2023-06-22 LAB — CYTOLOGY - PAP
Chlamydia: NEGATIVE
Comment: NEGATIVE
Comment: NEGATIVE
Comment: NEGATIVE
Comment: NORMAL
Diagnosis: NEGATIVE
High risk HPV: NEGATIVE
Neisseria Gonorrhea: NEGATIVE
Trichomonas: NEGATIVE

## 2023-06-28 DIAGNOSIS — F800 Phonological disorder: Secondary | ICD-10-CM | POA: Diagnosis not present

## 2023-06-28 DIAGNOSIS — M2659 Other dentofacial functional abnormalities: Secondary | ICD-10-CM | POA: Diagnosis not present

## 2023-07-05 DIAGNOSIS — F800 Phonological disorder: Secondary | ICD-10-CM | POA: Diagnosis not present

## 2023-07-05 DIAGNOSIS — M2659 Other dentofacial functional abnormalities: Secondary | ICD-10-CM | POA: Diagnosis not present

## 2023-07-06 DIAGNOSIS — F411 Generalized anxiety disorder: Secondary | ICD-10-CM | POA: Diagnosis not present

## 2023-07-06 DIAGNOSIS — F4010 Social phobia, unspecified: Secondary | ICD-10-CM | POA: Diagnosis not present

## 2023-07-06 DIAGNOSIS — F4312 Post-traumatic stress disorder, chronic: Secondary | ICD-10-CM | POA: Diagnosis not present

## 2023-07-11 DIAGNOSIS — F800 Phonological disorder: Secondary | ICD-10-CM | POA: Diagnosis not present

## 2023-07-11 DIAGNOSIS — M2659 Other dentofacial functional abnormalities: Secondary | ICD-10-CM | POA: Diagnosis not present

## 2023-07-13 DIAGNOSIS — F4312 Post-traumatic stress disorder, chronic: Secondary | ICD-10-CM | POA: Diagnosis not present

## 2023-07-13 DIAGNOSIS — F4010 Social phobia, unspecified: Secondary | ICD-10-CM | POA: Diagnosis not present

## 2023-07-13 DIAGNOSIS — F411 Generalized anxiety disorder: Secondary | ICD-10-CM | POA: Diagnosis not present

## 2023-07-18 DIAGNOSIS — M2659 Other dentofacial functional abnormalities: Secondary | ICD-10-CM | POA: Diagnosis not present

## 2023-07-18 DIAGNOSIS — F800 Phonological disorder: Secondary | ICD-10-CM | POA: Diagnosis not present

## 2023-07-20 DIAGNOSIS — F4312 Post-traumatic stress disorder, chronic: Secondary | ICD-10-CM | POA: Diagnosis not present

## 2023-07-20 DIAGNOSIS — F411 Generalized anxiety disorder: Secondary | ICD-10-CM | POA: Diagnosis not present

## 2023-07-20 DIAGNOSIS — F4010 Social phobia, unspecified: Secondary | ICD-10-CM | POA: Diagnosis not present

## 2023-07-26 DIAGNOSIS — M2659 Other dentofacial functional abnormalities: Secondary | ICD-10-CM | POA: Diagnosis not present

## 2023-07-26 DIAGNOSIS — F800 Phonological disorder: Secondary | ICD-10-CM | POA: Diagnosis not present

## 2023-07-27 DIAGNOSIS — F411 Generalized anxiety disorder: Secondary | ICD-10-CM | POA: Diagnosis not present

## 2023-07-27 DIAGNOSIS — F4312 Post-traumatic stress disorder, chronic: Secondary | ICD-10-CM | POA: Diagnosis not present

## 2023-07-27 DIAGNOSIS — F4010 Social phobia, unspecified: Secondary | ICD-10-CM | POA: Diagnosis not present

## 2023-08-03 DIAGNOSIS — F411 Generalized anxiety disorder: Secondary | ICD-10-CM | POA: Diagnosis not present

## 2023-08-03 DIAGNOSIS — F4010 Social phobia, unspecified: Secondary | ICD-10-CM | POA: Diagnosis not present

## 2023-08-03 DIAGNOSIS — F4312 Post-traumatic stress disorder, chronic: Secondary | ICD-10-CM | POA: Diagnosis not present

## 2023-08-04 DIAGNOSIS — M2659 Other dentofacial functional abnormalities: Secondary | ICD-10-CM | POA: Diagnosis not present

## 2023-08-04 DIAGNOSIS — F800 Phonological disorder: Secondary | ICD-10-CM | POA: Diagnosis not present

## 2023-08-08 DIAGNOSIS — M2659 Other dentofacial functional abnormalities: Secondary | ICD-10-CM | POA: Diagnosis not present

## 2023-08-08 DIAGNOSIS — F800 Phonological disorder: Secondary | ICD-10-CM | POA: Diagnosis not present

## 2023-08-12 DIAGNOSIS — F411 Generalized anxiety disorder: Secondary | ICD-10-CM | POA: Diagnosis not present

## 2023-08-12 DIAGNOSIS — F4010 Social phobia, unspecified: Secondary | ICD-10-CM | POA: Diagnosis not present

## 2023-08-12 DIAGNOSIS — F4312 Post-traumatic stress disorder, chronic: Secondary | ICD-10-CM | POA: Diagnosis not present

## 2023-08-15 DIAGNOSIS — F800 Phonological disorder: Secondary | ICD-10-CM | POA: Diagnosis not present

## 2023-08-15 DIAGNOSIS — M2659 Other dentofacial functional abnormalities: Secondary | ICD-10-CM | POA: Diagnosis not present

## 2023-08-17 DIAGNOSIS — F4312 Post-traumatic stress disorder, chronic: Secondary | ICD-10-CM | POA: Diagnosis not present

## 2023-08-17 DIAGNOSIS — F411 Generalized anxiety disorder: Secondary | ICD-10-CM | POA: Diagnosis not present

## 2023-08-17 DIAGNOSIS — F4010 Social phobia, unspecified: Secondary | ICD-10-CM | POA: Diagnosis not present

## 2023-08-22 DIAGNOSIS — F800 Phonological disorder: Secondary | ICD-10-CM | POA: Diagnosis not present

## 2023-08-22 DIAGNOSIS — M2659 Other dentofacial functional abnormalities: Secondary | ICD-10-CM | POA: Diagnosis not present

## 2023-08-25 DIAGNOSIS — F509 Eating disorder, unspecified: Secondary | ICD-10-CM | POA: Diagnosis not present

## 2023-08-25 DIAGNOSIS — F331 Major depressive disorder, recurrent, moderate: Secondary | ICD-10-CM | POA: Diagnosis not present

## 2023-08-25 DIAGNOSIS — Z1341 Encounter for autism screening: Secondary | ICD-10-CM | POA: Diagnosis not present

## 2023-08-29 DIAGNOSIS — M2659 Other dentofacial functional abnormalities: Secondary | ICD-10-CM | POA: Diagnosis not present

## 2023-08-29 DIAGNOSIS — F800 Phonological disorder: Secondary | ICD-10-CM | POA: Diagnosis not present

## 2023-08-30 DIAGNOSIS — F331 Major depressive disorder, recurrent, moderate: Secondary | ICD-10-CM | POA: Diagnosis not present

## 2023-08-30 DIAGNOSIS — F509 Eating disorder, unspecified: Secondary | ICD-10-CM | POA: Diagnosis not present

## 2023-08-30 DIAGNOSIS — Z1341 Encounter for autism screening: Secondary | ICD-10-CM | POA: Diagnosis not present

## 2023-08-30 DIAGNOSIS — F411 Generalized anxiety disorder: Secondary | ICD-10-CM | POA: Diagnosis not present

## 2023-08-31 DIAGNOSIS — F411 Generalized anxiety disorder: Secondary | ICD-10-CM | POA: Diagnosis not present

## 2023-08-31 DIAGNOSIS — F4010 Social phobia, unspecified: Secondary | ICD-10-CM | POA: Diagnosis not present

## 2023-08-31 DIAGNOSIS — F4312 Post-traumatic stress disorder, chronic: Secondary | ICD-10-CM | POA: Diagnosis not present

## 2023-09-05 DIAGNOSIS — F800 Phonological disorder: Secondary | ICD-10-CM | POA: Diagnosis not present

## 2023-09-05 DIAGNOSIS — M2659 Other dentofacial functional abnormalities: Secondary | ICD-10-CM | POA: Diagnosis not present

## 2023-09-07 DIAGNOSIS — F4312 Post-traumatic stress disorder, chronic: Secondary | ICD-10-CM | POA: Diagnosis not present

## 2023-09-07 DIAGNOSIS — F411 Generalized anxiety disorder: Secondary | ICD-10-CM | POA: Diagnosis not present

## 2023-09-07 DIAGNOSIS — F4010 Social phobia, unspecified: Secondary | ICD-10-CM | POA: Diagnosis not present

## 2023-09-09 DIAGNOSIS — F509 Eating disorder, unspecified: Secondary | ICD-10-CM | POA: Diagnosis not present

## 2023-09-09 DIAGNOSIS — Z1341 Encounter for autism screening: Secondary | ICD-10-CM | POA: Diagnosis not present

## 2023-09-09 DIAGNOSIS — F331 Major depressive disorder, recurrent, moderate: Secondary | ICD-10-CM | POA: Diagnosis not present

## 2023-09-14 DIAGNOSIS — F800 Phonological disorder: Secondary | ICD-10-CM | POA: Diagnosis not present

## 2023-09-14 DIAGNOSIS — M2659 Other dentofacial functional abnormalities: Secondary | ICD-10-CM | POA: Diagnosis not present

## 2023-09-26 DIAGNOSIS — F411 Generalized anxiety disorder: Secondary | ICD-10-CM | POA: Diagnosis not present

## 2023-09-26 DIAGNOSIS — F331 Major depressive disorder, recurrent, moderate: Secondary | ICD-10-CM | POA: Diagnosis not present

## 2023-09-26 DIAGNOSIS — F509 Eating disorder, unspecified: Secondary | ICD-10-CM | POA: Diagnosis not present

## 2023-09-26 DIAGNOSIS — Z1341 Encounter for autism screening: Secondary | ICD-10-CM | POA: Diagnosis not present

## 2023-09-29 DIAGNOSIS — F4312 Post-traumatic stress disorder, chronic: Secondary | ICD-10-CM | POA: Diagnosis not present

## 2023-09-29 DIAGNOSIS — F4010 Social phobia, unspecified: Secondary | ICD-10-CM | POA: Diagnosis not present

## 2023-10-03 DIAGNOSIS — M2659 Other dentofacial functional abnormalities: Secondary | ICD-10-CM | POA: Diagnosis not present

## 2023-10-03 DIAGNOSIS — F800 Phonological disorder: Secondary | ICD-10-CM | POA: Diagnosis not present

## 2023-10-06 DIAGNOSIS — F4312 Post-traumatic stress disorder, chronic: Secondary | ICD-10-CM | POA: Diagnosis not present

## 2023-10-06 DIAGNOSIS — F4010 Social phobia, unspecified: Secondary | ICD-10-CM | POA: Diagnosis not present

## 2023-10-06 DIAGNOSIS — F411 Generalized anxiety disorder: Secondary | ICD-10-CM | POA: Diagnosis not present

## 2023-10-10 DIAGNOSIS — M2659 Other dentofacial functional abnormalities: Secondary | ICD-10-CM | POA: Diagnosis not present

## 2023-10-10 DIAGNOSIS — F800 Phonological disorder: Secondary | ICD-10-CM | POA: Diagnosis not present

## 2023-10-11 DIAGNOSIS — F331 Major depressive disorder, recurrent, moderate: Secondary | ICD-10-CM | POA: Diagnosis not present

## 2023-10-11 DIAGNOSIS — Z1341 Encounter for autism screening: Secondary | ICD-10-CM | POA: Diagnosis not present

## 2023-10-11 DIAGNOSIS — F509 Eating disorder, unspecified: Secondary | ICD-10-CM | POA: Diagnosis not present

## 2023-10-11 DIAGNOSIS — F411 Generalized anxiety disorder: Secondary | ICD-10-CM | POA: Diagnosis not present

## 2023-10-13 DIAGNOSIS — F4312 Post-traumatic stress disorder, chronic: Secondary | ICD-10-CM | POA: Diagnosis not present

## 2023-10-13 DIAGNOSIS — F411 Generalized anxiety disorder: Secondary | ICD-10-CM | POA: Diagnosis not present

## 2023-10-13 DIAGNOSIS — F4010 Social phobia, unspecified: Secondary | ICD-10-CM | POA: Diagnosis not present

## 2023-10-18 DIAGNOSIS — F800 Phonological disorder: Secondary | ICD-10-CM | POA: Diagnosis not present

## 2023-10-18 DIAGNOSIS — M2659 Other dentofacial functional abnormalities: Secondary | ICD-10-CM | POA: Diagnosis not present

## 2023-10-24 DIAGNOSIS — M2659 Other dentofacial functional abnormalities: Secondary | ICD-10-CM | POA: Diagnosis not present

## 2023-10-24 DIAGNOSIS — F800 Phonological disorder: Secondary | ICD-10-CM | POA: Diagnosis not present

## 2023-11-07 DIAGNOSIS — M2659 Other dentofacial functional abnormalities: Secondary | ICD-10-CM | POA: Diagnosis not present

## 2023-11-07 DIAGNOSIS — F800 Phonological disorder: Secondary | ICD-10-CM | POA: Diagnosis not present

## 2023-11-14 DIAGNOSIS — F800 Phonological disorder: Secondary | ICD-10-CM | POA: Diagnosis not present

## 2023-11-14 DIAGNOSIS — M2659 Other dentofacial functional abnormalities: Secondary | ICD-10-CM | POA: Diagnosis not present

## 2023-11-16 DIAGNOSIS — F4010 Social phobia, unspecified: Secondary | ICD-10-CM | POA: Diagnosis not present

## 2023-11-16 DIAGNOSIS — F411 Generalized anxiety disorder: Secondary | ICD-10-CM | POA: Diagnosis not present

## 2023-11-16 DIAGNOSIS — F4312 Post-traumatic stress disorder, chronic: Secondary | ICD-10-CM | POA: Diagnosis not present

## 2023-11-21 DIAGNOSIS — M2659 Other dentofacial functional abnormalities: Secondary | ICD-10-CM | POA: Diagnosis not present

## 2023-11-21 DIAGNOSIS — F800 Phonological disorder: Secondary | ICD-10-CM | POA: Diagnosis not present

## 2023-12-05 DIAGNOSIS — M2659 Other dentofacial functional abnormalities: Secondary | ICD-10-CM | POA: Diagnosis not present

## 2023-12-05 DIAGNOSIS — F800 Phonological disorder: Secondary | ICD-10-CM | POA: Diagnosis not present

## 2023-12-07 DIAGNOSIS — F4312 Post-traumatic stress disorder, chronic: Secondary | ICD-10-CM | POA: Diagnosis not present

## 2023-12-07 DIAGNOSIS — F411 Generalized anxiety disorder: Secondary | ICD-10-CM | POA: Diagnosis not present

## 2023-12-07 DIAGNOSIS — F4010 Social phobia, unspecified: Secondary | ICD-10-CM | POA: Diagnosis not present

## 2023-12-12 DIAGNOSIS — F800 Phonological disorder: Secondary | ICD-10-CM | POA: Diagnosis not present

## 2023-12-12 DIAGNOSIS — M2659 Other dentofacial functional abnormalities: Secondary | ICD-10-CM | POA: Diagnosis not present

## 2023-12-13 DIAGNOSIS — F4010 Social phobia, unspecified: Secondary | ICD-10-CM | POA: Diagnosis not present

## 2023-12-13 DIAGNOSIS — F411 Generalized anxiety disorder: Secondary | ICD-10-CM | POA: Diagnosis not present

## 2023-12-13 DIAGNOSIS — F4312 Post-traumatic stress disorder, chronic: Secondary | ICD-10-CM | POA: Diagnosis not present

## 2023-12-19 DIAGNOSIS — F800 Phonological disorder: Secondary | ICD-10-CM | POA: Diagnosis not present

## 2023-12-19 DIAGNOSIS — M2659 Other dentofacial functional abnormalities: Secondary | ICD-10-CM | POA: Diagnosis not present

## 2023-12-21 DIAGNOSIS — F4312 Post-traumatic stress disorder, chronic: Secondary | ICD-10-CM | POA: Diagnosis not present

## 2023-12-21 DIAGNOSIS — F411 Generalized anxiety disorder: Secondary | ICD-10-CM | POA: Diagnosis not present

## 2023-12-21 DIAGNOSIS — F4010 Social phobia, unspecified: Secondary | ICD-10-CM | POA: Diagnosis not present

## 2023-12-23 ENCOUNTER — Telehealth: Payer: Self-pay | Admitting: Family Medicine

## 2023-12-26 DIAGNOSIS — F800 Phonological disorder: Secondary | ICD-10-CM | POA: Diagnosis not present

## 2023-12-26 DIAGNOSIS — M2659 Other dentofacial functional abnormalities: Secondary | ICD-10-CM | POA: Diagnosis not present

## 2023-12-28 DIAGNOSIS — F4312 Post-traumatic stress disorder, chronic: Secondary | ICD-10-CM | POA: Diagnosis not present

## 2023-12-28 DIAGNOSIS — F411 Generalized anxiety disorder: Secondary | ICD-10-CM | POA: Diagnosis not present

## 2023-12-28 DIAGNOSIS — F4010 Social phobia, unspecified: Secondary | ICD-10-CM | POA: Diagnosis not present

## 2024-01-04 DIAGNOSIS — F4312 Post-traumatic stress disorder, chronic: Secondary | ICD-10-CM | POA: Diagnosis not present

## 2024-01-04 DIAGNOSIS — F411 Generalized anxiety disorder: Secondary | ICD-10-CM | POA: Diagnosis not present

## 2024-01-04 DIAGNOSIS — F4010 Social phobia, unspecified: Secondary | ICD-10-CM | POA: Diagnosis not present

## 2024-01-09 DIAGNOSIS — M2659 Other dentofacial functional abnormalities: Secondary | ICD-10-CM | POA: Diagnosis not present

## 2024-01-09 DIAGNOSIS — F800 Phonological disorder: Secondary | ICD-10-CM | POA: Diagnosis not present

## 2024-01-23 DIAGNOSIS — F800 Phonological disorder: Secondary | ICD-10-CM | POA: Diagnosis not present

## 2024-01-23 DIAGNOSIS — M2659 Other dentofacial functional abnormalities: Secondary | ICD-10-CM | POA: Diagnosis not present

## 2024-01-25 DIAGNOSIS — F411 Generalized anxiety disorder: Secondary | ICD-10-CM | POA: Diagnosis not present

## 2024-01-25 DIAGNOSIS — F4312 Post-traumatic stress disorder, chronic: Secondary | ICD-10-CM | POA: Diagnosis not present

## 2024-01-25 DIAGNOSIS — F4010 Social phobia, unspecified: Secondary | ICD-10-CM | POA: Diagnosis not present

## 2024-01-30 DIAGNOSIS — F800 Phonological disorder: Secondary | ICD-10-CM | POA: Diagnosis not present

## 2024-01-30 DIAGNOSIS — M2659 Other dentofacial functional abnormalities: Secondary | ICD-10-CM | POA: Diagnosis not present

## 2024-02-03 DIAGNOSIS — F4312 Post-traumatic stress disorder, chronic: Secondary | ICD-10-CM | POA: Diagnosis not present

## 2024-02-03 DIAGNOSIS — F411 Generalized anxiety disorder: Secondary | ICD-10-CM | POA: Diagnosis not present

## 2024-02-03 DIAGNOSIS — F4010 Social phobia, unspecified: Secondary | ICD-10-CM | POA: Diagnosis not present

## 2024-02-07 ENCOUNTER — Telehealth: Admitting: Family Medicine

## 2024-02-07 ENCOUNTER — Encounter: Payer: Self-pay | Admitting: Family Medicine

## 2024-02-07 DIAGNOSIS — F329 Major depressive disorder, single episode, unspecified: Secondary | ICD-10-CM

## 2024-02-07 DIAGNOSIS — F411 Generalized anxiety disorder: Secondary | ICD-10-CM

## 2024-02-07 DIAGNOSIS — F5104 Psychophysiologic insomnia: Secondary | ICD-10-CM

## 2024-02-07 DIAGNOSIS — F3342 Major depressive disorder, recurrent, in full remission: Secondary | ICD-10-CM

## 2024-02-07 MED ORDER — ESCITALOPRAM OXALATE 20 MG PO TABS: 20.0000 mg | ORAL_TABLET | Freq: Every day | ORAL | 1 refills | Status: AC

## 2024-02-07 NOTE — Assessment & Plan Note (Signed)
 Generalized anxiety disorder and depression are managed with Lexapro  and mental health therapy. Lexapro  has been effective, particularly for anxiety, but residual symptoms persist. She has been on Lexapro  for almost a year and a half. The current dose may not fully address her symptoms, including potential impacts on sleep. Increasing the dose of Lexapro  may help with anxiety-related insomnia. Combining medication with therapy is supported by studies showing improved outcomes. - Increase Lexapro  to 20 mg daily. - Send prescription for Lexapro  20 mg to [facility]. - Continue mental health therapy.

## 2024-02-07 NOTE — Assessment & Plan Note (Signed)
 Insomnia may be related to anxiety and depression. She experiences difficulty falling asleep due to ruminating thoughts. She is currently using melatonin. Increasing the Lexapro  dose may help with anxiety-related insomnia. - Increase Lexapro  to 20 mg daily to address anxiety-related insomnia. - Continue melatonin as needed.

## 2024-02-07 NOTE — Progress Notes (Signed)
 MyChart Video Visit    Virtual Visit via Video Note   This format is felt to be most appropriate for this patient at this time. Physical exam was limited by quality of the video and audio technology used for the visit.    Patient location: home Provider location: [facility] Persons involved in the visit: patient, provider  I discussed the limitations of evaluation and management by telemedicine and the availability of in person appointments. The patient expressed understanding and agreed to proceed.  Patient: Lauren Weaver   DOB: 12/02/1990   33 y.o. Female  MRN: [id number] Visit Date: 02/07/2024  Today's healthcare provider: Jon Eva, MD   Chief Complaint  Patient presents with   Medication Refill   Depression   Anxiety   Subjective    HPI   Discussed the use of AI scribe software for clinical note transcription with the patient, who gave verbal consent to proceed.  History of Present Illness   Lauren Weaver is a 33 year old female who presents for a follow-up on Lexapro  treatment.  She has been on Lexapro  for almost a year and a half, which effectively manages her anxiety symptoms, though she still experiences intermittent anxiety. She expresses concerns about excessive sleep and difficulty falling asleep due to ruminating thoughts and an inability to relax, which she attributes to anxiety. She continues to take melatonin to aid her sleep. She is currently taking Lexapro  at a dose of 10 mg daily and finds mental health therapy beneficial.          02/07/2024    1:14 PM 06/20/2023   10:50 AM  GAD 7 : Generalized Anxiety Score  Nervous, Anxious, on Edge 2 2  Control/stop worrying 2 3  Worry too much - different things 2 2  Trouble relaxing 2 2  Restless 1 0  Easily annoyed or irritable 2 1  Afraid - awful might happen 2 1  Total GAD 7 Score 13 11  Anxiety Difficulty Very difficult Somewhat difficult     Review of  Systems      Objective    There were no vitals taken for this visit.     Physical Exam Constitutional:      General: She is not in acute distress.    Appearance: Normal appearance.  HENT:     Head: Normocephalic.  Pulmonary:     Effort: Pulmonary effort is normal. No respiratory distress.  Neurological:     Mental Status: She is alert and oriented to person, place, and time. Mental status is at baseline.        Assessment & Plan     Problem List Items Addressed This Visit       Other   GAD (generalized anxiety disorder) - Primary   Generalized anxiety disorder and depression are managed with Lexapro  and mental health therapy. Lexapro  has been effective, particularly for anxiety, but residual symptoms persist. She has been on Lexapro  for almost a year and a half. The current dose may not fully address her symptoms, including potential impacts on sleep. Increasing the dose of Lexapro  may help with anxiety-related insomnia. Combining medication with therapy is supported by studies showing improved outcomes. - Increase Lexapro  to 20 mg daily. - Send prescription for Lexapro  20 mg to [facility]. - Continue mental health therapy.      Relevant Medications   escitalopram  (LEXAPRO ) 20 MG tablet   MDD (major depressive disorder)   Generalized anxiety disorder and depression  are managed with Lexapro  and mental health therapy. Lexapro  has been effective, particularly for anxiety, but residual symptoms persist. She has been on Lexapro  for almost a year and a half. The current dose may not fully address her symptoms, including potential impacts on sleep. Increasing the dose of Lexapro  may help with anxiety-related insomnia. Combining medication with therapy is supported by studies showing improved outcomes. - Increase Lexapro  to 20 mg daily. - Send prescription for Lexapro  20 mg to [facility]. - Continue mental health therapy.      Relevant Medications   escitalopram   (LEXAPRO ) 20 MG tablet   Psychophysiological insomnia   Insomnia may be related to anxiety and depression. She experiences difficulty falling asleep due to ruminating thoughts. She is currently using melatonin. Increasing the Lexapro  dose may help with anxiety-related insomnia. - Increase Lexapro  to 20 mg daily to address anxiety-related insomnia. - Continue melatonin as needed.         Meds ordered this encounter  Medications   escitalopram  (LEXAPRO ) 20 MG tablet    Sig: Take 1 tablet (20 mg total) by mouth daily.    Dispense:  90 tablet    Refill:  1     Return in about 4 months (around 06/08/2024) for CPE after 06/20/24.     I discussed the assessment and treatment plan with the patient. The patient was provided an opportunity to ask questions and all were answered. The patient agreed with the plan and demonstrated an understanding of the instructions.   The patient was advised to call back or seek an in-person evaluation if the symptoms worsen or if the condition fails to improve as anticipated.   Jon Eva, MD [facility] [phone] (phone) [phone] (fax)  [facility]

## 2024-02-08 DIAGNOSIS — F411 Generalized anxiety disorder: Secondary | ICD-10-CM | POA: Diagnosis not present

## 2024-02-08 DIAGNOSIS — F4312 Post-traumatic stress disorder, chronic: Secondary | ICD-10-CM | POA: Diagnosis not present

## 2024-02-15 DIAGNOSIS — F411 Generalized anxiety disorder: Secondary | ICD-10-CM | POA: Diagnosis not present

## 2024-02-15 DIAGNOSIS — F4312 Post-traumatic stress disorder, chronic: Secondary | ICD-10-CM | POA: Diagnosis not present

## 2024-02-15 DIAGNOSIS — F4010 Social phobia, unspecified: Secondary | ICD-10-CM | POA: Diagnosis not present

## 2024-02-20 DIAGNOSIS — M2659 Other dentofacial functional abnormalities: Secondary | ICD-10-CM | POA: Diagnosis not present

## 2024-02-22 DIAGNOSIS — F4312 Post-traumatic stress disorder, chronic: Secondary | ICD-10-CM | POA: Diagnosis not present

## 2024-02-22 DIAGNOSIS — F4010 Social phobia, unspecified: Secondary | ICD-10-CM | POA: Diagnosis not present

## 2024-02-22 DIAGNOSIS — F411 Generalized anxiety disorder: Secondary | ICD-10-CM | POA: Diagnosis not present

## 2024-02-27 DIAGNOSIS — M2659 Other dentofacial functional abnormalities: Secondary | ICD-10-CM | POA: Diagnosis not present

## 2024-02-29 DIAGNOSIS — F4312 Post-traumatic stress disorder, chronic: Secondary | ICD-10-CM | POA: Diagnosis not present

## 2024-02-29 DIAGNOSIS — F411 Generalized anxiety disorder: Secondary | ICD-10-CM | POA: Diagnosis not present

## 2024-03-05 DIAGNOSIS — M2659 Other dentofacial functional abnormalities: Secondary | ICD-10-CM | POA: Diagnosis not present

## 2024-03-07 DIAGNOSIS — F4312 Post-traumatic stress disorder, chronic: Secondary | ICD-10-CM | POA: Diagnosis not present

## 2024-03-07 DIAGNOSIS — F4010 Social phobia, unspecified: Secondary | ICD-10-CM | POA: Diagnosis not present

## 2024-03-07 DIAGNOSIS — F411 Generalized anxiety disorder: Secondary | ICD-10-CM | POA: Diagnosis not present

## 2024-03-12 DIAGNOSIS — M2659 Other dentofacial functional abnormalities: Secondary | ICD-10-CM | POA: Diagnosis not present

## 2024-03-12 DIAGNOSIS — F800 Phonological disorder: Secondary | ICD-10-CM | POA: Diagnosis not present

## 2024-03-19 DIAGNOSIS — M2659 Other dentofacial functional abnormalities: Secondary | ICD-10-CM | POA: Diagnosis not present

## 2024-03-19 DIAGNOSIS — F800 Phonological disorder: Secondary | ICD-10-CM | POA: Diagnosis not present

## 2024-03-21 DIAGNOSIS — F4010 Social phobia, unspecified: Secondary | ICD-10-CM | POA: Diagnosis not present

## 2024-03-21 DIAGNOSIS — F411 Generalized anxiety disorder: Secondary | ICD-10-CM | POA: Diagnosis not present

## 2024-03-21 DIAGNOSIS — F4312 Post-traumatic stress disorder, chronic: Secondary | ICD-10-CM | POA: Diagnosis not present

## 2024-03-26 DIAGNOSIS — F800 Phonological disorder: Secondary | ICD-10-CM | POA: Diagnosis not present

## 2024-03-26 DIAGNOSIS — M2659 Other dentofacial functional abnormalities: Secondary | ICD-10-CM | POA: Diagnosis not present

## 2024-03-28 DIAGNOSIS — F411 Generalized anxiety disorder: Secondary | ICD-10-CM | POA: Diagnosis not present

## 2024-03-28 DIAGNOSIS — F4010 Social phobia, unspecified: Secondary | ICD-10-CM | POA: Diagnosis not present

## 2024-03-28 DIAGNOSIS — F4312 Post-traumatic stress disorder, chronic: Secondary | ICD-10-CM | POA: Diagnosis not present

## 2024-04-02 DIAGNOSIS — M2659 Other dentofacial functional abnormalities: Secondary | ICD-10-CM | POA: Diagnosis not present

## 2024-04-02 DIAGNOSIS — F800 Phonological disorder: Secondary | ICD-10-CM | POA: Diagnosis not present

## 2024-04-04 DIAGNOSIS — F411 Generalized anxiety disorder: Secondary | ICD-10-CM | POA: Diagnosis not present

## 2024-04-04 DIAGNOSIS — F4010 Social phobia, unspecified: Secondary | ICD-10-CM | POA: Diagnosis not present

## 2024-04-04 DIAGNOSIS — F4312 Post-traumatic stress disorder, chronic: Secondary | ICD-10-CM | POA: Diagnosis not present
